# Patient Record
Sex: Male | Born: 1980 | Race: Black or African American | Hispanic: No | Marital: Married | Smoking: Current some day smoker
Health system: Southern US, Community
[De-identification: ages and names within clinical notes are randomized; demographics above are authoritative.]

## PROBLEM LIST (undated history)

## (undated) DIAGNOSIS — IMO0002 Reserved for concepts with insufficient information to code with codable children: Secondary | ICD-10-CM

## (undated) DIAGNOSIS — B20 Human immunodeficiency virus [HIV] disease: Secondary | ICD-10-CM

## (undated) DIAGNOSIS — K219 Gastro-esophageal reflux disease without esophagitis: Secondary | ICD-10-CM

## (undated) HISTORY — PX: WISDOM TOOTH EXTRACTION: SHX21

## (undated) HISTORY — DX: Gastro-esophageal reflux disease without esophagitis: K21.9

## (undated) HISTORY — PX: COLONOSCOPY: SHX174

## (undated) HISTORY — DX: Reserved for concepts with insufficient information to code with codable children: IMO0002

## (undated) HISTORY — PX: UPPER GASTROINTESTINAL ENDOSCOPY: SHX188

---

## 2003-11-26 ENCOUNTER — Emergency Department (HOSPITAL_COMMUNITY): Admission: EM | Admit: 2003-11-26 | Discharge: 2003-11-27 | Payer: Self-pay | Admitting: Emergency Medicine

## 2008-01-20 ENCOUNTER — Emergency Department (HOSPITAL_COMMUNITY): Admission: EM | Admit: 2008-01-20 | Discharge: 2008-01-20 | Payer: Self-pay | Admitting: Emergency Medicine

## 2008-09-05 ENCOUNTER — Emergency Department (HOSPITAL_COMMUNITY): Admission: EM | Admit: 2008-09-05 | Discharge: 2008-09-06 | Payer: Self-pay | Admitting: Emergency Medicine

## 2008-12-12 ENCOUNTER — Ambulatory Visit (HOSPITAL_BASED_OUTPATIENT_CLINIC_OR_DEPARTMENT_OTHER): Admission: RE | Admit: 2008-12-12 | Discharge: 2008-12-12 | Payer: Self-pay | Admitting: Otolaryngology

## 2008-12-17 ENCOUNTER — Ambulatory Visit: Payer: Self-pay | Admitting: Internal Medicine

## 2008-12-20 ENCOUNTER — Ambulatory Visit: Payer: Self-pay | Admitting: Radiology

## 2008-12-20 ENCOUNTER — Emergency Department (HOSPITAL_BASED_OUTPATIENT_CLINIC_OR_DEPARTMENT_OTHER): Admission: EM | Admit: 2008-12-20 | Discharge: 2008-12-20 | Payer: Self-pay | Admitting: Emergency Medicine

## 2009-10-25 ENCOUNTER — Emergency Department (HOSPITAL_COMMUNITY): Admission: EM | Admit: 2009-10-25 | Discharge: 2009-10-25 | Payer: Self-pay | Admitting: Emergency Medicine

## 2010-05-03 ENCOUNTER — Emergency Department (HOSPITAL_COMMUNITY): Admission: EM | Admit: 2010-05-03 | Discharge: 2010-05-03 | Payer: Self-pay | Admitting: Emergency Medicine

## 2010-05-07 ENCOUNTER — Emergency Department (HOSPITAL_COMMUNITY): Admission: EM | Admit: 2010-05-07 | Discharge: 2010-05-07 | Payer: Self-pay | Admitting: Emergency Medicine

## 2010-06-30 ENCOUNTER — Emergency Department (HOSPITAL_COMMUNITY): Admission: EM | Admit: 2010-06-30 | Discharge: 2010-06-30 | Payer: Self-pay | Admitting: Emergency Medicine

## 2010-07-02 ENCOUNTER — Emergency Department (HOSPITAL_COMMUNITY): Admission: EM | Admit: 2010-07-02 | Discharge: 2010-07-02 | Payer: Self-pay | Admitting: Emergency Medicine

## 2010-07-04 ENCOUNTER — Emergency Department (HOSPITAL_COMMUNITY): Admission: EM | Admit: 2010-07-04 | Discharge: 2010-07-04 | Payer: Self-pay | Admitting: Emergency Medicine

## 2010-09-09 ENCOUNTER — Emergency Department (HOSPITAL_COMMUNITY): Admission: EM | Admit: 2010-09-09 | Discharge: 2010-09-09 | Payer: Self-pay | Admitting: Emergency Medicine

## 2010-10-19 ENCOUNTER — Emergency Department (HOSPITAL_COMMUNITY)
Admission: EM | Admit: 2010-10-19 | Discharge: 2010-10-20 | Payer: Self-pay | Source: Home / Self Care | Admitting: Emergency Medicine

## 2010-12-01 ENCOUNTER — Ambulatory Visit (HOSPITAL_COMMUNITY)
Admission: RE | Admit: 2010-12-01 | Discharge: 2010-12-01 | Payer: Self-pay | Source: Home / Self Care | Attending: Psychiatry | Admitting: Psychiatry

## 2010-12-05 ENCOUNTER — Emergency Department (HOSPITAL_COMMUNITY)
Admission: EM | Admit: 2010-12-05 | Discharge: 2010-12-05 | Payer: Self-pay | Source: Home / Self Care | Admitting: Emergency Medicine

## 2010-12-11 ENCOUNTER — Other Ambulatory Visit (HOSPITAL_COMMUNITY)
Admission: RE | Admit: 2010-12-11 | Discharge: 2010-12-12 | Payer: Self-pay | Source: Home / Self Care | Attending: Psychiatry | Admitting: Psychiatry

## 2010-12-18 ENCOUNTER — Ambulatory Visit (HOSPITAL_COMMUNITY): Admit: 2010-12-18 | Payer: Self-pay | Admitting: Licensed Clinical Social Worker

## 2011-02-12 LAB — HEMOCCULT GUIAC POC 1CARD (OFFICE): Fecal Occult Bld: POSITIVE

## 2011-02-14 LAB — DIFFERENTIAL
Basophils Absolute: 0.1 10*3/uL (ref 0.0–0.1)
Basophils Relative: 1 % (ref 0–1)
Eosinophils Relative: 1 % (ref 0–5)
Monocytes Relative: 8 % (ref 3–12)

## 2011-02-14 LAB — CBC
HCT: 36.8 % — ABNORMAL LOW (ref 39.0–52.0)
MCV: 82.5 fL (ref 78.0–100.0)
RDW: 13.7 % (ref 11.5–15.5)
WBC: 6.8 10*3/uL (ref 4.0–10.5)

## 2011-02-15 LAB — BASIC METABOLIC PANEL
BUN: 9 mg/dL (ref 6–23)
CO2: 28 mEq/L (ref 19–32)
Calcium: 8.7 mg/dL (ref 8.4–10.5)
Chloride: 104 mEq/L (ref 96–112)
Creatinine, Ser: 1.13 mg/dL (ref 0.4–1.5)
GFR calc non Af Amer: >60 mL/min (ref >60)
Glucose, Bld: 86 mg/dL (ref 70–99)

## 2011-02-15 LAB — URINE CULTURE
Colony Count: NO GROWTH
Culture  Setup Time: 201108040246
Culture: NO GROWTH

## 2011-02-15 LAB — DIFFERENTIAL
Basophils Absolute: 0 10*3/uL (ref 0.0–0.1)
Basophils Relative: 1 % (ref 0–1)
Eosinophils Absolute: 0 K/uL (ref 0.0–0.7)
Eosinophils Relative: 0 % (ref 0–5)
Lymphocytes Relative: 52 % — ABNORMAL HIGH (ref 12–46)
Lymphs Abs: 1.6 K/uL (ref 0.7–4.0)
Monocytes Absolute: 0.2 10*3/uL (ref 0.1–1.0)
Monocytes Relative: 6 % (ref 3–12)
Neutro Abs: 1.3 10*3/uL — ABNORMAL LOW (ref 1.7–7.7)
Neutrophils Relative %: 42 % — ABNORMAL LOW (ref 43–77)

## 2011-02-15 LAB — CBC
HCT: 39.7 % (ref 39.0–52.0)
Hemoglobin: 13.3 g/dL (ref 13.0–17.0)
MCH: 28 pg (ref 26.0–34.0)
MCHC: 33.6 g/dL (ref 30.0–36.0)
MCV: 83.4 fL (ref 78.0–100.0)
Platelets: 118 K/uL — ABNORMAL LOW (ref 150–400)
RBC: 4.76 MIL/uL (ref 4.22–5.81)
RDW: 12.1 % (ref 11.5–15.5)
WBC: 3.2 10*3/uL — ABNORMAL LOW (ref 4.0–10.5)

## 2011-02-15 LAB — URINALYSIS, ROUTINE W REFLEX MICROSCOPIC
Bilirubin Urine: NEGATIVE
Glucose, UA: NEGATIVE mg/dL
Hgb urine dipstick: NEGATIVE
Ketones, ur: 15 mg/dL — AB
Nitrite: NEGATIVE
Protein, ur: NEGATIVE mg/dL
Specific Gravity, Urine: 1.023 (ref 1.005–1.030)
Urobilinogen, UA: 4 mg/dL — ABNORMAL HIGH (ref 0.0–1.0)
pH: 7.5 (ref 5.0–8.0)

## 2011-02-15 LAB — CULTURE, BLOOD (ROUTINE X 2)
Culture: NO GROWTH
Culture: NO GROWTH

## 2011-02-15 LAB — BASIC METABOLIC PANEL WITH GFR
GFR calc Af Amer: >60 mL/min (ref >60)
Potassium: 3.5 meq/L (ref 3.5–5.1)
Sodium: 140 meq/L (ref 135–145)

## 2011-02-18 LAB — URINALYSIS, ROUTINE W REFLEX MICROSCOPIC
Bilirubin Urine: NEGATIVE
Ketones, ur: NEGATIVE mg/dL
Ketones, ur: NEGATIVE mg/dL
Nitrite: NEGATIVE
Protein, ur: NEGATIVE mg/dL
Urobilinogen, UA: 0.2 mg/dL (ref 0.0–1.0)
Urobilinogen, UA: 1 mg/dL (ref 0.0–1.0)

## 2011-02-18 LAB — DIFFERENTIAL
Basophils Absolute: 0.1 10*3/uL (ref 0.0–0.1)
Eosinophils Relative: 2 % (ref 0–5)
Eosinophils Relative: 2 % (ref 0–5)
Lymphocytes Relative: 37 % (ref 12–46)
Lymphs Abs: 2.5 10*3/uL (ref 0.7–4.0)
Lymphs Abs: 6.2 10*3/uL — ABNORMAL HIGH (ref 0.7–4.0)
Monocytes Absolute: 0.4 10*3/uL (ref 0.1–1.0)
Neutro Abs: 5 10*3/uL (ref 1.7–7.7)
Neutrophils Relative %: 40 % — ABNORMAL LOW (ref 43–77)

## 2011-02-18 LAB — BASIC METABOLIC PANEL
BUN: 13 mg/dL (ref 6–23)
CO2: 23 mEq/L (ref 19–32)
Calcium: 9.5 mg/dL (ref 8.4–10.5)
Chloride: 107 mEq/L (ref 96–112)
Creatinine, Ser: 1.48 mg/dL (ref 0.4–1.5)
GFR calc non Af Amer: 55 mL/min — ABNORMAL LOW (ref >60)

## 2011-02-18 LAB — HEPATIC FUNCTION PANEL
AST: 29 U/L (ref 0–37)
Albumin: 4.1 g/dL (ref 3.5–5.2)
Alkaline Phosphatase: 49 U/L (ref 39–117)
Bilirubin, Direct: 0.2 mg/dL (ref 0.0–0.3)
Total Bilirubin: 0.9 mg/dL (ref 0.3–1.2)
Total Protein: 7.2 g/dL (ref 6.0–8.3)

## 2011-02-18 LAB — RAPID URINE DRUG SCREEN, HOSP PERFORMED
Benzodiazepines: NOT DETECTED
Cocaine: NOT DETECTED
Tetrahydrocannabinol: POSITIVE — AB

## 2011-02-18 LAB — CBC
HCT: 39.8 % (ref 39.0–52.0)
MCHC: 32.8 g/dL (ref 30.0–36.0)
MCHC: 32.9 g/dL (ref 30.0–36.0)
MCV: 86.8 fL (ref 78.0–100.0)
Platelets: 179 10*3/uL (ref 150–400)
Platelets: 207 10*3/uL (ref 150–400)
RBC: 4.59 MIL/uL (ref 4.22–5.81)
RDW: 12.5 % (ref 11.5–15.5)
WBC: 6.9 10*3/uL (ref 4.0–10.5)

## 2011-02-18 LAB — COMPREHENSIVE METABOLIC PANEL
AST: 17 U/L (ref 0–37)
Albumin: 4.2 g/dL (ref 3.5–5.2)
Chloride: 105 mEq/L (ref 96–112)
Creatinine, Ser: 1.1 mg/dL (ref 0.4–1.5)
GFR calc Af Amer: >60 mL/min (ref >60)
Total Bilirubin: 1.6 mg/dL — ABNORMAL HIGH (ref 0.3–1.2)

## 2011-03-18 LAB — BASIC METABOLIC PANEL
BUN: 12 mg/dL (ref 6–23)
CO2: 28 mEq/L (ref 19–32)
Chloride: 104 mEq/L (ref 96–112)
Glucose, Bld: 75 mg/dL (ref 70–99)
Potassium: 3.8 mEq/L (ref 3.5–5.1)

## 2011-03-18 LAB — CBC
HCT: 39.5 % (ref 39.0–52.0)
MCHC: 32.9 g/dL (ref 30.0–36.0)
MCV: 85.8 fL (ref 78.0–100.0)
Platelets: 159 10*3/uL (ref 150–400)
RDW: 11.8 % (ref 11.5–15.5)

## 2011-03-18 LAB — GLUCOSE, CAPILLARY: Glucose-Capillary: 76 mg/dL (ref 70–99)

## 2011-03-18 LAB — DIFFERENTIAL
Basophils Absolute: 0.1 10*3/uL (ref 0.0–0.1)
Eosinophils Absolute: 0.1 10*3/uL (ref 0.0–0.7)
Eosinophils Relative: 2 % (ref 0–5)

## 2011-04-16 NOTE — Procedures (Signed)
NAME:  [name], [name]                ACCOUNT NO.:  [id number]   MEDICAL RECORD NO.:  [id number]          PATIENT TYPE:  OUT   LOCATION:  SLEEP CENTER                 FACILITY:  [facility]   PHYSICIAN:  Clinton D. Maple Hudson, MD, FCCP, FACPDATE OF BIRTH:  30-Apr-1979   DATE OF STUDY:  12/12/2008                            NOCTURNAL POLYSOMNOGRAM   REFERRING PHYSICIAN:  Antony Contras, MD   REFERRING PHYSICIAN:  Antony Contras, MD   INDICATION FOR STUDY:  Hypersomnia with sleep apnea.   EPWORTH SLEEPINESS SCORE:  Epworth sleepiness score 3/24.  BMI 25.  Weight 195 pounds.  Height 74 inches.  Neck 17 inches.   MEDICATIONS:  Home medications were not listed.   SLEEP ARCHITECTURE:  Total sleep time 355 minutes with sleep efficiency  74.6%.  Stage I was 11%.  Stage II 66.4%.  Stage III absent.  REM 22.6%  of total sleep time.  Sleep latency 104 minutes.  REM latency 66  minutes.  Awake after sleep onset 16.5 minutes.  Arousal index 14.2.  No  bedtime medication was taken.   RESPIRATORY DATA:  Apnea-hypopnea index (AHI) 1.4 per hour, respiratory  disturbance index (RDI) 5.1 per hour.  A total of 8 events were scored  including 1 obstructive apnea and 7 hypopneas.  All events were recorded  as supine.  Respiratory events related to arousal count 22, index 3.7  per hour.  There were insufficient events to permit CPAP titration by  split protocol on the study night.   OXYGEN DATA:  Moderately loud snoring with oxygen desaturation to a  nadir of 91%.  Mean oxygen saturation through the study was 94.8% on  room air.   CARDIAC DATA:  Normal sinus rhythm.   MOVEMENT-PARASOMNIA:  No significant movement disturbance.  No bathroom  trips.   IMPRESSION/RECOMMENDATION:  Occasional respiratory events with arousal,  within normal limits, AHI 1.4 per hour (normal range 0-5 per hour).  Events were all recorded as supine.  Moderately loud snoring with oxygen  desaturation to a nadir of 91%.      Clinton  D. Maple Hudson, MD, [facility], FACP  Diplomate, Biomedical engineer of Sleep Medicine  Electronically Signed     CDY/MEDQ  D:  12/17/2008 10:42:05  T:  12/17/2008 23:55:49  Job:  [id number]

## 2012-07-12 ENCOUNTER — Encounter (HOSPITAL_BASED_OUTPATIENT_CLINIC_OR_DEPARTMENT_OTHER): Payer: Self-pay | Admitting: *Deleted

## 2012-07-12 DIAGNOSIS — F17200 Nicotine dependence, unspecified, uncomplicated: Secondary | ICD-10-CM | POA: Insufficient documentation

## 2012-07-12 DIAGNOSIS — R112 Nausea with vomiting, unspecified: Secondary | ICD-10-CM | POA: Insufficient documentation

## 2012-07-12 DIAGNOSIS — Z21 Asymptomatic human immunodeficiency virus [HIV] infection status: Secondary | ICD-10-CM | POA: Insufficient documentation

## 2012-07-12 DIAGNOSIS — R197 Diarrhea, unspecified: Secondary | ICD-10-CM | POA: Insufficient documentation

## 2012-07-12 MED ORDER — ONDANSETRON 8 MG PO TBDP
8.0000 mg | ORAL_TABLET | Freq: Once | ORAL | Status: AC
  Administered 2012-07-12: 8 mg via ORAL
  Filled 2012-07-12: qty 1

## 2012-07-12 NOTE — ED Notes (Signed)
N/V/D since Friday. Feels dehydrated.

## 2012-07-13 ENCOUNTER — Emergency Department (HOSPITAL_BASED_OUTPATIENT_CLINIC_OR_DEPARTMENT_OTHER)
Admission: EM | Admit: 2012-07-13 | Discharge: 2012-07-13 | Disposition: A | Discharge status: Home / Self Care | Payer: BC Managed Care – PPO | Attending: Emergency Medicine | Admitting: Emergency Medicine

## 2012-07-13 DIAGNOSIS — K529 Noninfective gastroenteritis and colitis, unspecified: Secondary | ICD-10-CM

## 2012-07-13 HISTORY — DX: Human immunodeficiency virus (HIV) disease: B20

## 2012-07-13 LAB — URINALYSIS, ROUTINE W REFLEX MICROSCOPIC
Bilirubin Urine: NEGATIVE
Ketones, ur: NEGATIVE mg/dL
Leukocytes, UA: NEGATIVE
Nitrite: NEGATIVE
Protein, ur: NEGATIVE mg/dL
Urobilinogen, UA: 1 mg/dL (ref 0.0–1.0)
pH: 6 (ref 5.0–8.0)

## 2012-07-13 LAB — CBC WITH DIFFERENTIAL/PLATELET
Basophils Absolute: 0.1 10*3/uL (ref 0.0–0.1)
Basophils Relative: 1 % (ref 0–1)
Eosinophils Absolute: 0.1 10*3/uL (ref 0.0–0.7)
MCH: 29 pg (ref 26.0–34.0)
MCHC: 34.3 g/dL (ref 30.0–36.0)
Monocytes Absolute: 0.7 10*3/uL (ref 0.1–1.0)
Neutro Abs: 4 10*3/uL (ref 1.7–7.7)
Neutrophils Relative %: 50 % (ref 43–77)
RDW: 12 % (ref 11.5–15.5)

## 2012-07-13 LAB — BASIC METABOLIC PANEL
Chloride: 102 mEq/L (ref 96–112)
Creatinine, Ser: 1.3 mg/dL (ref 0.50–1.35)
GFR calc Af Amer: 82 mL/min — ABNORMAL LOW (ref >90)
GFR calc non Af Amer: 71 mL/min — ABNORMAL LOW (ref >90)
Potassium: 3.8 mEq/L (ref 3.5–5.1)

## 2012-07-13 MED ORDER — SODIUM CHLORIDE 0.9 % IV BOLUS (SEPSIS)
1000.0000 mL | Freq: Once | INTRAVENOUS | Status: AC
  Administered 2012-07-13: 1000 mL via INTRAVENOUS

## 2012-07-13 MED ORDER — DIPHENOXYLATE-ATROPINE 2.5-0.025 MG PO TABS
2.0000 | ORAL_TABLET | Freq: Once | ORAL | Status: AC
  Administered 2012-07-13: 2 via ORAL
  Filled 2012-07-13: qty 2

## 2012-07-13 MED ORDER — ONDANSETRON 8 MG PO TBDP: 8.0000 mg | ORAL_TABLET | Freq: Three times a day (TID) | ORAL | Status: AC | PRN

## 2012-07-13 NOTE — ED Notes (Signed)
Ambulated in the hall. Tolerated well. MD updated

## 2012-07-13 NOTE — ED Notes (Signed)
MD at bedside. 

## 2012-07-13 NOTE — ED Provider Notes (Signed)
History  This chart was scribed for Hanley Seamen, MD by Shari Heritage. The patient was seen in room MH05/MH05. Patient's care was started at 2222.     CSN: [id number]  Arrival date & time 07/12/12  2222   First MD Initiated Contact with Patient 07/13/12 0058      Chief Complaint  Patient presents with  . Nausea, vomiting and diarrhea     The history is provided by the patient. No language interpreter was used.   Dillon Wilson is a 31 y.o. male with a history of HIV who presents to the Emergency Department complaining of persistent nausea, vomiting and diarrhea onset 3 days ago. Other symptoms include generalized weakness and light-headedness. He also states that he feels dehydrated. Patient hasn't reported any specific aggravating or relieving factors. Patient is a current everyday smoker.  PCP - Merdis Delay [place], [place])   Past Medical History  Diagnosis Date  . HIV disease     Past Surgical History  Procedure Date  . Wisdom tooth extraction     No family history on file.  History  Substance Use Topics  . Smoking status: Current Everyday Smoker  . Smokeless tobacco: Not on file  . Alcohol Use: Yes     occasional      Review of Systems A complete 10 system review of systems was obtained and all systems are negative except as noted in the HPI and PMH.   Allergies  Ciprofloxacin  Home Medications   Current Outpatient Rx  Name Route Sig Dispense Refill  . AMOXICILLIN 500 MG PO TABS Oral Take 500 mg by mouth every 6 (six) hours. For 7 days starting 06/29/12    . DARUNAVIR ETHANOLATE 400 MG PO TABS Oral Take 800 mg by mouth daily. Through a study at [facility]    . EMTRICITABINE-TENOFOVIR 200-300 MG PO TABS Oral Take 1 tablet by mouth daily. Through a study at [facility]    . HYDROCODONE-ACETAMINOPHEN PO Oral Take 1 tablet by mouth every 4 (four) hours as needed. For pain    . IBUPROFEN 200 MG PO TABS Oral Take 600 mg by mouth every 6  (six) hours as needed. For pain    . OMEPRAZOLE 20 MG PO CPDR Oral Take 20 mg by mouth daily.    . STUDY MEDICATION Oral Take 1 tablet by mouth daily. Cobicistat 150 mg through [facility]      BP 130/81  Pulse 73  Temp 98.9 F (37.2 C) (Oral)  Resp 20  SpO2 99%  Physical Exam  Constitutional: He is oriented to person, place, and time. He appears well-developed and well-nourished. He has a sickly appearance.       Patient is ill-appearing.  HENT:  Head: Normocephalic and atraumatic.  Eyes: Pupils are equal, round, and reactive to light.  Cardiovascular: Normal rate and regular rhythm.   No murmur heard. Pulmonary/Chest: Effort normal and breath sounds normal. No respiratory distress. He has no wheezes. He has no rales.  Abdominal: Soft. He exhibits no distension. Bowel sounds are decreased. There is no tenderness.  Neurological: He is alert and oriented to person, place, and time.    ED Course  Procedures (including critical care time) DIAGNOSTIC STUDIES: Oxygen Saturation is 99% on room air, normal by my interpretation.    COORDINATION OF CARE: 1:01am- Patient informed of current plan for treatment and evaluation and agrees with plan at this time.       MDM  Nursing notes and vitals signs, including pulse oximetry, reviewed.  Summary of this visit's results, reviewed by myself:  Labs:  Results for orders placed during the hospital encounter of 07/13/12  CBC WITH DIFFERENTIAL      Component Value Range   WBC 8.0  4.0 - 10.5 K/uL   RBC 4.38  4.22 - 5.81 MIL/uL   Hemoglobin 12.7 (*) 13.0 - 17.0 g/dL   HCT 16.1 (*) 09.6 - 04.5 %   MCV 84.5  78.0 - 100.0 fL   MCH 29.0  26.0 - 34.0 pg   MCHC 34.3  30.0 - 36.0 g/dL   RDW 40.9  81.1 - 91.4 %   Platelets 185  150 - 400 K/uL   Neutrophils Relative 50  43 - 77 %   Neutro Abs 4.0  1.7 - 7.7 K/uL   Lymphocytes Relative 39  12 - 46 %   Lymphs Abs 3.1  0.7 - 4.0 K/uL   Monocytes Relative 9  3 - 12 %    Monocytes Absolute 0.7  0.1 - 1.0 K/uL   Eosinophils Relative 1  0 - 5 %   Eosinophils Absolute 0.1  0.0 - 0.7 K/uL   Basophils Relative 1  0 - 1 %   Basophils Absolute 0.1  0.0 - 0.1 K/uL  BASIC METABOLIC PANEL      Component Value Range   Sodium 137  135 - 145 mEq/L   Potassium 3.8  3.5 - 5.1 mEq/L   Chloride 102  96 - 112 mEq/L   CO2 27  19 - 32 mEq/L   Glucose, Bld 106 (*) 70 - 99 mg/dL   BUN 14  6 - 23 mg/dL   Creatinine, Ser 7.82  0.50 - 1.35 mg/dL   Calcium 9.4  8.4 - 95.6 mg/dL   GFR calc non Af Amer 71 (*) >90 mL/min   GFR calc Af Amer 82 (*) >90 mL/min  URINALYSIS, ROUTINE W REFLEX MICROSCOPIC      Component Value Range   Color, Urine YELLOW  YELLOW   APPearance CLEAR  CLEAR   Specific Gravity, Urine 1.016  1.005 - 1.030   pH 6.0  5.0 - 8.0   Glucose, UA NEGATIVE  NEGATIVE mg/dL   Hgb urine dipstick NEGATIVE  NEGATIVE   Bilirubin Urine NEGATIVE  NEGATIVE   Ketones, ur NEGATIVE  NEGATIVE mg/dL   Protein, ur NEGATIVE  NEGATIVE mg/dL   Urobilinogen, UA 1.0  0.0 - 1.0 mg/dL   Nitrite NEGATIVE  NEGATIVE   Leukocytes, UA NEGATIVE  NEGATIVE   I personally performed the services described in this documentation, which was scribed in my presence.  The recorded information has been reviewed and considered.  4:08 AM Feels much better after 2 L of normal saline IV. Able to ambulate without lightheadedness.    Hanley Seamen, MD 07/13/12 [phone]

## 2012-07-24 ENCOUNTER — Ambulatory Visit (INDEPENDENT_AMBULATORY_CARE_PROVIDER_SITE_OTHER): Payer: BC Managed Care – PPO | Admitting: Emergency Medicine

## 2012-07-24 VITALS — BP 114/72 | HR 77 | Temp 98.4°F | Resp 16 | Ht 71.58 in | Wt 182.4 lb

## 2012-07-24 DIAGNOSIS — F419 Anxiety disorder, unspecified: Secondary | ICD-10-CM

## 2012-07-24 DIAGNOSIS — F329 Major depressive disorder, single episode, unspecified: Secondary | ICD-10-CM

## 2012-07-24 DIAGNOSIS — F341 Dysthymic disorder: Secondary | ICD-10-CM

## 2012-07-24 MED ORDER — LORAZEPAM 0.5 MG PO TABS: 0.5000 mg | ORAL_TABLET | Freq: Three times a day (TID) | ORAL | Status: AC | PRN

## 2012-07-24 MED ORDER — PAROXETINE HCL 20 MG PO TABS: 20.0000 mg | ORAL_TABLET | ORAL | Status: DC

## 2012-07-24 NOTE — Progress Notes (Signed)
   Date:  07/24/2012   Name:  JAZON JIPSON   DOB:  [birth year]   MRN:  [id number] Gender: male  Age: 31 y.o.  PCP:  No primary provider on file.    Chief Complaint: Rash and Anxiety   History of Present Illness:  Dillon Wilson is a 31 y.o. pleasant patient who presents with the following:  Has severe anxiety.  Mother is in hospice with stage 4 colon cancer and he has numerous other stresses including his job and home issues.  Is not sleeping and feels as though he is "losing it'.  Denies thoughts of self harm or harm to others.  He is experiencing severe itching on his scalp.  Denies contact with other with lice, scabies other parasitic diseases and has no rash or evidence of infestation.    There is no problem list on file for this patient.   Past Medical History  Diagnosis Date  . HIV disease     Past Surgical History  Procedure Date  . Wisdom tooth extraction     History  Substance Use Topics  . Smoking status: Current Everyday Smoker  . Smokeless tobacco: Not on file  . Alcohol Use: Yes     occasional    No family history on file.  Allergies  Allergen Reactions  . Ciprofloxacin Diarrhea and Nausea Only    Medication list has been reviewed and updated.  Current Outpatient Prescriptions on File Prior to Visit  Medication Sig Dispense Refill  . darunavir (PREZISTA) 400 MG tablet Take 800 mg by mouth daily. Through a study at [facility]      . emtricitabine-tenofovir (TRUVADA) 200-300 MG per tablet Take 1 tablet by mouth daily. Through a study at [facility]      . ibuprofen (ADVIL,MOTRIN) 200 MG tablet Take 600 mg by mouth every 6 (six) hours as needed. For pain      . omeprazole (PRILOSEC) 20 MG capsule Take 20 mg by mouth daily.      . STUDY MEDICATION Take 1 tablet by mouth daily. Cobicistat 150 mg through [facility]      . amoxicillin (AMOXIL) 500 MG tablet Take 500 mg by mouth every 6 (six) hours. For 7 days starting  06/29/12      . HYDROCODONE-ACETAMINOPHEN PO Take 1 tablet by mouth every 4 (four) hours as needed. For pain      . PARoxetine (PAXIL) 20 MG tablet Take 1 tablet (20 mg total) by mouth every morning.  30 tablet  5    Review of Systems:  As per HPI, otherwise negative.    Physical Examination: Filed Vitals:   07/24/12 1802  BP: 114/72  Pulse: 77  Temp: 98.4 F (36.9 C)  Resp: 16   Filed Vitals:   07/24/12 1802  Height: 5' 11.58" (1.818 m)  Weight: 182 lb 6.4 oz (82.736 kg)   Body mass index is 25.03 kg/(m^2). Ideal Body Weight: Weight in (lb) to have BMI = 25: 181.8    GEN: WDWN, NAD, Non-toxic, Alert & Oriented x 3 HEENT: Atraumatic, Normocephalic.  Ears and Nose: No external deformity. EXTR: No clubbing/cyanosis/edema NEURO: Normal gait.  PSYCH: Normally interactive. Conversant. Not depressed.  Appears emotionally labile and anxious at times.    Assessment and Plan: Anxiety and situational depression. paxil Ativan Follow up in one week if not improved.  Carmelina Dane, MD

## 2012-09-15 ENCOUNTER — Telehealth: Payer: Self-pay

## 2012-09-15 NOTE — Telephone Encounter (Signed)
Office notes from last visit printed and ready for pick up. Patient notified.

## 2012-09-15 NOTE — Telephone Encounter (Signed)
Patient is requesting a copy of his last office visit for him to come by and pick-up.  Patient is aware that we do ask for 24-48 hrs to have records ready.   BEST#: [phone]

## 2012-10-02 DIAGNOSIS — F411 Generalized anxiety disorder: Secondary | ICD-10-CM | POA: Insufficient documentation

## 2012-11-03 ENCOUNTER — Ambulatory Visit (INDEPENDENT_AMBULATORY_CARE_PROVIDER_SITE_OTHER): Payer: BC Managed Care – PPO | Admitting: Physician Assistant

## 2012-11-03 VITALS — BP 120/72 | HR 79 | Temp 98.8°F | Resp 18 | Ht 73.0 in | Wt 182.0 lb

## 2012-11-03 DIAGNOSIS — R509 Fever, unspecified: Secondary | ICD-10-CM

## 2012-11-03 DIAGNOSIS — R110 Nausea: Secondary | ICD-10-CM

## 2012-11-03 DIAGNOSIS — R1011 Right upper quadrant pain: Secondary | ICD-10-CM

## 2012-11-03 DIAGNOSIS — B20 Human immunodeficiency virus [HIV] disease: Secondary | ICD-10-CM

## 2012-11-03 LAB — COMPREHENSIVE METABOLIC PANEL
Albumin: 4 g/dL (ref 3.5–5.2)
Alkaline Phosphatase: 66 U/L (ref 39–117)
BUN: 11 mg/dL (ref 6–23)
CO2: 28 mEq/L (ref 19–32)
Calcium: 9.1 mg/dL (ref 8.4–10.5)
Chloride: 102 mEq/L (ref 96–112)
Glucose, Bld: 86 mg/dL (ref 70–99)
Potassium: 4.1 mEq/L (ref 3.5–5.3)
Sodium: 139 mEq/L (ref 135–145)
Total Protein: 7.4 g/dL (ref 6.0–8.3)

## 2012-11-03 LAB — POCT INFLUENZA A/B
Influenza A, POC: NEGATIVE
Influenza B, POC: NEGATIVE

## 2012-11-03 LAB — POCT CBC
Granulocyte percent: 73.1 %G (ref 37–80)
HCT, POC: 42.1 % — AB (ref 43.5–53.7)
Hemoglobin: 12.5 g/dL — AB (ref 14.1–18.1)
MCV: 88.8 fL (ref 80–97)
POC Granulocyte: 4.5 (ref 2–6.9)
POC LYMPH PERCENT: 21.7 %L (ref 10–50)
RBC: 4.74 M/uL (ref 4.69–6.13)

## 2012-11-03 MED ORDER — ONDANSETRON 8 MG PO TBDP: 8.0000 mg | ORAL_TABLET | Freq: Three times a day (TID) | ORAL | Status: DC | PRN

## 2012-11-03 NOTE — Progress Notes (Signed)
Subjective:    Patient ID: Dillon Harton., male    DOB: [birth year], 31 y.o.   MRN: [id number]  HPI   Dillon Wilson is a 31 yr old male who states he thinks he has the flu.  Symptoms began 4 days ago.  He complains of stomach cramps, HA, diarrhea, chills, sweats, and fever.  Tmax 101.49F.  Has not had diarrhea in two days now.  But is now nauseous.  No vomiting.  Not taking much by mouth.  Last night a little chicken soup.  This AM some dry toast.  States he is keeping hydrated with gatorade and water.  Some coworkers have been sick.  Denies any food exposures.  No travel.  Has had sore throat on and off.  Denies urinary symptoms.  Denies blood or mucus in the stool.    Received flu shot 4 days ago  Pt recently had RUQ u/s in [place] for abd pain.  States it was normal except for fatty liver.    Currently smokes 1ppd.  Review of Systems  Constitutional: Positive for fever, chills, diaphoresis and fatigue.  Respiratory: Negative.   Cardiovascular: Negative.   Gastrointestinal: Positive for nausea, abdominal pain and diarrhea. Negative for vomiting.  Musculoskeletal: Positive for myalgias and arthralgias.  Skin: Negative.   Neurological: Positive for light-headedness and headaches.       Objective:   Physical Exam  Vitals reviewed. Constitutional: He is oriented to person, place, and time. He appears well-developed. He appears ill.  HENT:  Head: Normocephalic and atraumatic.  Right Ear: Tympanic membrane and ear canal normal.  Left Ear: Tympanic membrane and ear canal normal.  Nose: Nose normal.  Mouth/Throat: Uvula is midline, oropharynx is clear and moist and mucous membranes are normal.  Neck: Neck supple.  Cardiovascular: Normal rate, regular rhythm and normal heart sounds.  Exam reveals no gallop and no friction rub.   No murmur heard. Pulmonary/Chest: Effort normal and breath sounds normal. He has no wheezes. He has no rales.  Abdominal: Soft. Normal appearance and bowel  sounds are normal. He exhibits no distension and no mass. There is tenderness in the right upper quadrant and epigastric area. There is guarding and positive Murphy's sign. There is no rigidity, no rebound, no CVA tenderness and no tenderness at McBurney's point.  Lymphadenopathy:    He has no cervical adenopathy.  Neurological: He is alert and oriented to person, place, and time.  Skin: Skin is warm and dry.  Psychiatric: He has a normal mood and affect. His behavior is normal.     Filed Vitals:   11/03/12 1439  BP: 120/72  Pulse: 79  Temp: 98.8 F (37.1 C)  Resp: 18     Results for orders placed in visit on 11/03/12  POCT CBC      Component Value Range   WBC 6.1  4.6 - 10.2 K/uL   Lymph, poc 1.3  0.6 - 3.4   POC LYMPH PERCENT 21.7  10 - 50 %L   MID (cbc) 0.3  0 - 0.9   POC MID % 5.2  0 - 12 %M   POC Granulocyte 4.5  2 - 6.9   Granulocyte percent 73.1  37 - 80 %G   RBC 4.74  4.69 - 6.13 M/uL   Hemoglobin 12.5 (*) 14.1 - 18.1 g/dL   HCT, POC 96.2 (*) 95.2 - 53.7 %   MCV 88.8  80 - 97 fL   MCH, POC 26.4 (*) 27 - 31.2  pg   MCHC 29.7 (*) 31.8 - 35.4 g/dL   RDW, POC 16.1     Platelet Count, POC 163  142 - 424 K/uL   MPV 9.1  0 - 99.8 fL  POCT INFLUENZA A/B      Component Value Range   Influenza A, POC Negative     Influenza B, POC Negative        Ultrasound report from [facility] 10/02/12: 1. Mild hepatic steatosis 2. Small volume of gallbladder sludge, no evidence of cholelithiasis or acute cholecytitis    Assessment & Plan:   1. Fever  POCT CBC, Comprehensive metabolic panel, Lipase, POCT Influenza A/B  2. RUQ pain  POCT CBC, Comprehensive metabolic panel, Lipase  3. Nausea  POCT CBC, Comprehensive metabolic panel, Lipase     Dillon Wilson is a 30 yr old male here with fever, chills, nausea, and RUQ pain.  Pt has a history of HIV and is followed by ID at [facility].  Last CD4 count in Oct 2013  was ~550.  White count today is normal and he is afebrile  in clinic.  Logged into [facility] Epic to review chart.  Apparently pt has had RUQ pain, nausea, and vomiting for several months now.  He tells me he has a GI appointment in February, which was the first available.  Ultrasound done at [facility] 10/02/12 indicated sludge but no stones or acute cholecystitis.  I have ordered a cmet and lipase.  Pt requested a flu test, which was negative.  He received 1L of NS here in clinic.  I have sent zofran to his pharmacy to help with the nausea he is experiencing.  Source of fever is unclear, but he is afebrile currently and has taken no antipyretics today.  Encouraged him to contact ID, who functions as his PCP, and see if they can move his GI appt up.  Pt is more concerned about fever/chills than about his abd pain.  Encouraged Tylenol/Advil for fever reduction.  Discussed with pt that I am reassured that his white count is normal and that his last CD4 was also normal.  Encouraged bland diet and plenty of fluids.  Encouraged him to contact infectious disease who manages his care.

## 2012-11-03 NOTE — Patient Instructions (Addendum)
Continue taking Tylenol or Advil for fever.  Use Zofran every 8 hours as needed for nausea.  Eat a bland diet for the next several days.  Avoid high fat foods, caffeine, and alcohol.  Hydrate as much as possible.  Water is best.  Call ID tomorrow and ask if they are able to get your GI appointment moved earlier.  If symptoms are worsening or not improving please contact us or your primary care provider.

## 2012-11-04 ENCOUNTER — Encounter (HOSPITAL_COMMUNITY): Payer: Self-pay

## 2012-11-04 ENCOUNTER — Emergency Department (HOSPITAL_COMMUNITY)
Admission: EM | Admit: 2012-11-04 | Discharge: 2012-11-04 | Disposition: A | Discharge status: Home / Self Care | Payer: BC Managed Care – PPO | Attending: Emergency Medicine | Admitting: Emergency Medicine

## 2012-11-04 ENCOUNTER — Emergency Department (HOSPITAL_COMMUNITY): Payer: BC Managed Care – PPO

## 2012-11-04 DIAGNOSIS — K219 Gastro-esophageal reflux disease without esophagitis: Secondary | ICD-10-CM | POA: Insufficient documentation

## 2012-11-04 DIAGNOSIS — Z79899 Other long term (current) drug therapy: Secondary | ICD-10-CM | POA: Insufficient documentation

## 2012-11-04 DIAGNOSIS — L98499 Non-pressure chronic ulcer of skin of other sites with unspecified severity: Secondary | ICD-10-CM | POA: Insufficient documentation

## 2012-11-04 DIAGNOSIS — F17200 Nicotine dependence, unspecified, uncomplicated: Secondary | ICD-10-CM | POA: Insufficient documentation

## 2012-11-04 DIAGNOSIS — R1011 Right upper quadrant pain: Secondary | ICD-10-CM | POA: Insufficient documentation

## 2012-11-04 DIAGNOSIS — R197 Diarrhea, unspecified: Secondary | ICD-10-CM | POA: Insufficient documentation

## 2012-11-04 DIAGNOSIS — R110 Nausea: Secondary | ICD-10-CM | POA: Insufficient documentation

## 2012-11-04 DIAGNOSIS — B20 Human immunodeficiency virus [HIV] disease: Secondary | ICD-10-CM | POA: Insufficient documentation

## 2012-11-04 DIAGNOSIS — R509 Fever, unspecified: Secondary | ICD-10-CM | POA: Insufficient documentation

## 2012-11-04 LAB — LIPASE, BLOOD: Lipase: 19 U/L (ref 11–59)

## 2012-11-04 LAB — CBC WITH DIFFERENTIAL/PLATELET
Basophils Relative: 0 % (ref 0–1)
Eosinophils Absolute: 0.1 10*3/uL (ref 0.0–0.7)
Eosinophils Relative: 1 % (ref 0–5)
Hemoglobin: 12.1 g/dL — ABNORMAL LOW (ref 13.0–17.0)
Lymphs Abs: 1.6 10*3/uL (ref 0.7–4.0)
MCH: 28.3 pg (ref 26.0–34.0)
MCHC: 33 g/dL (ref 30.0–36.0)
MCV: 85.9 fL (ref 78.0–100.0)
Monocytes Relative: 8 % (ref 3–12)
Neutrophils Relative %: 65 % (ref 43–77)
Platelets: 154 10*3/uL (ref 150–400)
RBC: 4.27 MIL/uL (ref 4.22–5.81)

## 2012-11-04 LAB — URINALYSIS, ROUTINE W REFLEX MICROSCOPIC
Hgb urine dipstick: NEGATIVE
Ketones, ur: NEGATIVE mg/dL
Nitrite: NEGATIVE
Specific Gravity, Urine: 1.025 (ref 1.005–1.030)
Urobilinogen, UA: 2 mg/dL — ABNORMAL HIGH (ref 0.0–1.0)

## 2012-11-04 LAB — POCT I-STAT TROPONIN I: Troponin i, poc: 0 ng/mL (ref 0.00–0.08)

## 2012-11-04 LAB — COMPREHENSIVE METABOLIC PANEL
AST: 22 U/L (ref 0–37)
CO2: 24 mEq/L (ref 19–32)
Calcium: 9.1 mg/dL (ref 8.4–10.5)
Creatinine, Ser: 1.15 mg/dL (ref 0.50–1.35)
GFR calc Af Amer: >90 mL/min (ref >90)
GFR calc non Af Amer: 82 mL/min — ABNORMAL LOW (ref >90)

## 2012-11-04 MED ORDER — SODIUM CHLORIDE 0.9 % IV BOLUS (SEPSIS)
1000.0000 mL | Freq: Once | INTRAVENOUS | Status: AC
  Administered 2012-11-04: 1000 mL via INTRAVENOUS

## 2012-11-04 MED ORDER — MORPHINE SULFATE 4 MG/ML IJ SOLN
4.0000 mg | Freq: Once | INTRAMUSCULAR | Status: AC
  Administered 2012-11-04: 4 mg via INTRAVENOUS
  Filled 2012-11-04: qty 1

## 2012-11-04 MED ORDER — ONDANSETRON HCL 4 MG/2ML IJ SOLN
4.0000 mg | Freq: Once | INTRAMUSCULAR | Status: AC
  Administered 2012-11-04: 4 mg via INTRAVENOUS
  Filled 2012-11-04: qty 2

## 2012-11-04 NOTE — ED Notes (Signed)
Pt sts he had an irregular ultrasound of his gallbladder about 3 weeks ago.  Pt sts he had a fever last night and in experiencing abd pain(upper right quad). Denies n/v

## 2012-11-04 NOTE — ED Provider Notes (Signed)
Medical screening examination/treatment/procedure(s) were performed by non-physician practitioner and as supervising physician I was immediately available for consultation/collaboration.  Tobin Chad, MD 11/04/12 1352

## 2012-11-04 NOTE — ED Provider Notes (Signed)
History     CSN: [id number]  Arrival date & time 11/04/12  4034   First MD Initiated Contact with Patient 11/04/12 [phone]      Chief Complaint  Patient presents with  . Abdominal Pain    (Consider location/radiation/quality/duration/timing/severity/associated sxs/prior treatment) HPI Comments: 31 year old male with a history of HIV, GERD & stomach ulcer presents emergency department complaining of right upper quadrant and epigastric abdominal pain.  Onset of symptoms began Saturday evening and has been gradually worsening. Pain is moderate and does not radiate. No known exacerbating or alleviating factors. Associated symptoms include nausea, soft stools, fever of 101 last night, and night sweats. Patient denies any emesis, chest pain, shortness of breath, dyspnea on exertion, leg swelling, melena, hematochezia,   In reviewing pts medical records he was evaluated on 10/14/2012 complaining up similar symptoms that had been going on at that time for weeks and an [place] was don't as below on 11/1. It appear pts symptom onset is more accurately 1 month ago.   PCP: Merdis Delay [place], [place]), compliant on medications Patient HIV status as 10/9 with CD4 ct 569 and VL<50.   US ABDOMEN COMPLETE (10/02/2012 12:04 PM EDT) Impressions  1. Mild hepatic steatosis.   2. Small volume of gallbladder sludge, no evidence of cholelithiasis or acute cholecystitis.     Patient is a 31 y.o. male presenting with abdominal pain. The history is provided by the patient and medical records.  Abdominal Pain The primary symptoms of the illness include abdominal pain, fever, nausea and diarrhea ("softer then normal"). The primary symptoms of the illness do not include shortness of breath, vomiting or dysuria.  Symptoms associated with the illness do not include chills, constipation, urgency or hematuria.    Past Medical History  Diagnosis Date  . HIV disease   . GERD (gastroesophageal reflux disease)   .  Ulcer     Past Surgical History  Procedure Date  . Wisdom tooth extraction     Family History  Problem Relation Age of Onset  . Cancer Mother     colon  . Diabetes Father   . Hypertension Father   . Diabetes Sister   . Hypertension Sister   . Psoriasis Maternal Grandmother   . Hypertension Maternal Grandfather   . Cancer Maternal Grandfather     lung  . Diabetes Maternal Grandfather   . Diabetes Paternal Grandmother   . Alzheimer's disease Paternal Grandmother   . Cancer Paternal Grandfather     lung    History  Substance Use Topics  . Smoking status: Current Every Day Smoker  . Smokeless tobacco: Not on file  . Alcohol Use: Yes     Comment: occasional      Review of Systems  Constitutional: Positive for fever and appetite change. Negative for chills.  HENT: Negative for neck stiffness and dental problem.   Eyes: Negative for visual disturbance.  Respiratory: Negative for cough, chest tightness, shortness of breath and wheezing.   Cardiovascular: Negative for chest pain.  Gastrointestinal: Positive for nausea, abdominal pain and diarrhea ("softer then normal"). Negative for vomiting, constipation, blood in stool, abdominal distention, anal bleeding and rectal pain.  Genitourinary: Negative for dysuria, urgency, hematuria and flank pain.  Musculoskeletal: Negative for myalgias and arthralgias.  Skin: Negative for rash.  Neurological: Negative for dizziness, syncope, speech difficulty, numbness and headaches.  Hematological: Does not bruise/bleed easily.  All other systems reviewed and are negative.    Allergies  Ciprofloxacin  Home Medications  Current Outpatient Rx  Name  Route  Sig  Dispense  Refill  . DARUNAVIR ETHANOLATE 400 MG PO TABS   Oral   Take 800 mg by mouth daily. Through a study at [facility]         . EMTRICITABINE-TENOFOVIR 200-300 MG PO TABS   Oral   Take 1 tablet by mouth daily. Through a study at [facility]          . IBUPROFEN 200 MG PO TABS   Oral   Take 600 mg by mouth every 6 (six) hours as needed. For pain         . OMEPRAZOLE 20 MG PO CPDR   Oral   Take 20 mg by mouth daily.         . STUDY MEDICATION   Oral   Take 1 tablet by mouth daily. Cobicistat 150 mg through [facility]           BP 121/74  Pulse 88  Temp 98.6 F (37 C) (Oral)  Resp 18  SpO2 96%  Physical Exam  Nursing note and vitals reviewed. Constitutional: Vital signs are normal. He appears well-developed and well-nourished. No distress.  HENT:  Head: Normocephalic and atraumatic.  Mouth/Throat: Uvula is midline, oropharynx is clear and moist and mucous membranes are normal.  Eyes: Conjunctivae normal and EOM are normal. Pupils are equal, round, and reactive to light.  Neck: Normal range of motion and full passive range of motion without pain. Neck supple. No spinous process tenderness and no muscular tenderness present. No rigidity. No Brudzinski's sign noted.  Cardiovascular: Normal rate and regular rhythm.   Pulmonary/Chest: Effort normal and breath sounds normal. No accessory muscle usage. Not tachypneic. No respiratory distress.  Abdominal: Soft. Normal appearance. He exhibits no distension, no ascites, no pulsatile midline mass and no mass. There is tenderness in the right upper quadrant and epigastric area. There is no CVA tenderness. No hernia.    Lymphadenopathy:    He has no cervical adenopathy.  Neurological: He is alert.  Skin: Skin is warm and dry. No rash noted. He is not diaphoretic.  Psychiatric: He has a normal mood and affect. His speech is normal and behavior is normal.    ED Course  Procedures (including critical care time)  Labs Reviewed  URINALYSIS, ROUTINE W REFLEX MICROSCOPIC - Abnormal; Notable for the following:    Color, Urine AMBER (*)  BIOCHEMICALS MAY BE AFFECTED BY COLOR   Bilirubin Urine SMALL (*)     Urobilinogen, UA 2.0 (*)     All other components  within normal limits  CBC WITH DIFFERENTIAL - Abnormal; Notable for the following:    Hemoglobin 12.1 (*)     HCT 36.7 (*)     All other components within normal limits  POCT I-STAT TROPONIN I  COMPREHENSIVE METABOLIC PANEL  LIPASE, BLOOD   US Abdomen Complete  11/04/2012  *RADIOLOGY REPORT*  Clinical Data:  Epigastric abdominal pain.  COMPLETE ABDOMINAL ULTRASOUND  Comparison:  05/07/2010.  Findings:  Gallbladder:  No gallstones, gallbladder wall thickening, or pericholecystic fluid.  Common bile duct:  Normal in caliber measuring a maximum of 3.32mm.  Liver:  The liver is sonographically unremarkable.  There is normal echogenicity without focal lesions or intrahepatic biliary dilatation.  .  IVC:  Normal caliber.  Pancreas:  Sonographically normal.  Spleen:  Normal size and echogenicity without focal lesions.  Right Kidney:  10.1 cm in length. Normal renal cortical  thickness and echogenicity without focal lesions or hydronephrosis.  Left Kidney:  10.2 cm in length. Normal renal cortical thickness and echogenicity without focal lesions or hydronephrosis.  Abdominal aorta:  Normal caliber.  IMPRESSION: Unremarkable abdominal ultrasound examination.   Original Report Authenticated By: Rudie Meyer, M.D.      No diagnosis found.   BP 121/74  Pulse 88  Temp 98.6 F (37 C) (Oral)  Resp 18  SpO2 96%   MDM  RUQ & Epi abd pain  31 year old male with a history of HIV, GERD and stomach ulcers presents emergency department complaining of primarily epigastric and right upper quadrant abdominal pain.  Ultrasound reviewed without acute abnormalities.  Patient has scheduled a followup appointment with [facility] gastroenterology in February, however well provide local gastroenterologist to see if they can evaluate patient sooner.  Patient has been advised to begin taking a proton pump inhibitor as possible diagnosis of ulcer.  Strict instructions to return for the development of concerning symptoms  including persistent fever, melena, hyperemesis, or worsening abdominal pain.  All results discussed with patient who is agreeable with plan and currently stable for discharge.  Patient is nontoxic or ill appearing prior to discharge.        Jaci Carrel, [place] 11/04/12 1231

## 2012-11-09 ENCOUNTER — Telehealth: Payer: Self-pay

## 2012-11-09 NOTE — Telephone Encounter (Signed)
PT STATES HE WAS GIVEN AN OOW NOTE FOR TUES AND TO RETURN ON Wednesday. HOWEVER HIS JOB WON'T LET HIM COME BACK WITHOUT A NOTE OR A COPY PLEASE CALL [phone]

## 2012-11-09 NOTE — Telephone Encounter (Signed)
Unsure what he needs sent. Left message for him to call me back.

## 2012-11-13 NOTE — Telephone Encounter (Signed)
It is now Friday, he has not called me back about this, so he must no longer need it.

## 2013-02-03 ENCOUNTER — Ambulatory Visit (INDEPENDENT_AMBULATORY_CARE_PROVIDER_SITE_OTHER): Payer: BC Managed Care – PPO | Admitting: Family Medicine

## 2013-02-03 VITALS — BP 118/72 | HR 58 | Temp 97.5°F | Resp 16 | Ht 72.0 in | Wt 178.0 lb

## 2013-02-03 DIAGNOSIS — K5900 Constipation, unspecified: Secondary | ICD-10-CM

## 2013-02-03 DIAGNOSIS — Z21 Asymptomatic human immunodeficiency virus [HIV] infection status: Secondary | ICD-10-CM

## 2013-02-03 DIAGNOSIS — K644 Residual hemorrhoidal skin tags: Secondary | ICD-10-CM

## 2013-02-03 MED ORDER — HYDROCORTISONE ACETATE 25 MG RE SUPP: 25.0000 mg | Freq: Two times a day (BID) | RECTAL | Status: DC

## 2013-02-03 NOTE — Progress Notes (Signed)
[address]   [place], [place]  [address]   [phone]  Subjective:    Patient ID: Dillon Wilson., male    DOB: 10/14/1981, 32 y.o.   MRN: [id number]  HPI This 32 y.o. male presents for evaluation of constipation and hemorrhoids.    1. Hemorrhoid problems: onset one week ago.  Using suppositories preparation H, gel cream and ointment Preparation H.  Salt water baths once every other day.  No improvement.  No recent issues in three years; had visit at [facility] three years ago with thrombosed hemorrhoid.  Worried about recurrent hemorrhoid.  No bleeding.  +itching or burning.  Completed Anusol HC suppositories yesterday.  Mild pain with passing bowel movement. No fever/chills/sweats/malaise.  No dysuria, urgency, frequency, hematuria.  2.  Constipation: stool softeners.  Having bowel movements daily.  Suffering with constipation last week; no fever/chills/sweats. No nausea, vomiting, diarrhea.  No melena or bloody stools.    PCP: [facility] ID:  Janee Morn and ID is Twana Harriston/[facility] every six months.    Review of Systems  Constitutional: Negative for fever, chills, diaphoresis and fatigue.  Gastrointestinal: Positive for constipation and rectal pain. Negative for nausea, vomiting, abdominal pain, diarrhea, blood in stool and anal bleeding.  Genitourinary: Negative for dysuria, frequency, hematuria and flank pain.    Past Medical History  Diagnosis Date  . HIV disease   . GERD (gastroesophageal reflux disease)   . Ulcer     Past Surgical History  Procedure Laterality Date  . Wisdom tooth extraction      Prior to Admission medications   Medication Sig Start Date End Date Taking? Authorizing Provider  darunavir (PREZISTA) 400 MG tablet Take 800 mg by mouth daily. Through a study at [facility]   Yes Historical Provider, MD  emtricitabine-tenofovir (TRUVADA) 200-300 MG per tablet Take 1 tablet by mouth daily. Through a study at [facility]   Yes Historical  Provider, MD  omeprazole (PRILOSEC) 20 MG capsule Take 20 mg by mouth daily.   Yes Historical Provider, MD  STUDY MEDICATION Take 1 tablet by mouth daily. Cobicistat 150 mg through [facility]   Yes Historical Provider, MD  hydrocortisone (ANUSOL-HC) 25 MG suppository Place 1 suppository (25 mg total) rectally 2 (two) times daily. 02/03/13   Ethelda Chick, MD  ibuprofen (ADVIL,MOTRIN) 200 MG tablet Take 600 mg by mouth every 6 (six) hours as needed. For pain    Historical Provider, MD    Allergies  Allergen Reactions  . Ciprofloxacin Diarrhea and Nausea Only    History   Social History  . Marital Status: Single    Spouse Name: N/A    Number of Children: N/A  . Years of Education: N/A   Occupational History  . Not on file.   Social History Main Topics  . Smoking status: Current Every Day Smoker  . Smokeless tobacco: Not on file  . Alcohol Use: Yes     Comment: occasional  . Drug Use: No  . Sexually Active: Yes   Other Topics Concern  . Not on file   Social History Narrative  . No narrative on file    Family History  Problem Relation Age of Onset  . Cancer Mother     colon  . Diabetes Father   . Hypertension Father   . Diabetes Sister   . Hypertension Sister   . Psoriasis Maternal Grandmother   . Hypertension Maternal Grandfather   . Cancer Maternal  Grandfather     lung  . Diabetes Maternal Grandfather   . Diabetes Paternal Grandmother   . Alzheimer's disease Paternal Grandmother   . Cancer Paternal Grandfather     lung       Objective:   Physical Exam  Nursing note and vitals reviewed. Constitutional: He appears well-developed and well-nourished. No distress.  Abdominal: Soft. Bowel sounds are normal. He exhibits no distension and no mass. There is no tenderness. There is no rebound and no guarding.  Genitourinary: Rectal exam shows external hemorrhoid and tenderness. Rectal exam shows no internal hemorrhoid, no fissure, no mass and anal  tone normal.  TTP laterally; no induration; no fluctuants or warmth.  +scattered perianal warts non-tender or inflamed.  No visible anal fissure.  Skin: He is not diaphoretic.        Assessment & Plan:  Hemorrhoids, external  Unspecified constipation  HIV (human immunodeficiency virus infection)    1. Hemorrhoid External with inflammation:  New/recurrent.  No evidence of thrombosis, fissure, rectal abscess.  Rx for Anusol HC suppositories and increase to bid to tid use; increase baths to bid for next week; continue Preparation H.  Continue stool softeners. 2. Constipation:  New. Etiology to worsening hemorrhoids.  Continue stool softeners.  Increase water and fiber intake. 3.  HIV: stable.    Meds ordered this encounter  Medications  . hydrocortisone (ANUSOL-HC) 25 MG suppository    Sig: Place 1 suppository (25 mg total) rectally 2 (two) times daily.    Dispense:  24 suppository    Refill:  2

## 2013-02-03 NOTE — Patient Instructions (Addendum)
Hemorrhoids, external  Unspecified constipation    1.  SUPPOSITORIES TWICE DAILY. 2.  BATHS TWICE DAILY. 3.  CONTINUE STOOL SOFTENER. 4.  RETURN FOR INCREASING PAIN, FEVER.

## 2013-02-07 ENCOUNTER — Ambulatory Visit (INDEPENDENT_AMBULATORY_CARE_PROVIDER_SITE_OTHER): Payer: BC Managed Care – PPO | Admitting: Family Medicine

## 2013-02-07 VITALS — BP 122/78 | HR 61 | Temp 98.1°F | Resp 16 | Ht 72.0 in | Wt 177.6 lb

## 2013-02-07 DIAGNOSIS — K644 Residual hemorrhoidal skin tags: Secondary | ICD-10-CM

## 2013-02-07 DIAGNOSIS — K602 Anal fissure, unspecified: Secondary | ICD-10-CM

## 2013-02-07 MED ORDER — DILTIAZEM GEL 2 %: 1.0000 "application " | Freq: Three times a day (TID) | CUTANEOUS | Status: DC

## 2013-02-07 MED ORDER — LIDOCAINE HCL 2 % EX GEL: CUTANEOUS | Status: DC | PRN

## 2013-02-07 MED ORDER — KETOROLAC TROMETHAMINE 60 MG/2ML IM SOLN
60.0000 mg | Freq: Once | INTRAMUSCULAR | Status: AC
  Administered 2013-02-07: 60 mg via INTRAMUSCULAR

## 2013-02-07 NOTE — Progress Notes (Signed)
Subjective:    Patient ID: Dillon Wilson., male    DOB: 25-Jan-1981, 32 y.o.   MRN: [id number] Chief Complaint  Patient presents with  . Hemorrhoids   HPI  Dillon Wilson is a 32 yo male with a problems with chronic constipation and recurrent problem with hemorrhoids.  He has been using a wide variety of otc meds w/o improvement and was seen in clinic several days ago and put on Anusol [facility] suppositories but didn't really help. He used the anusol suppositories up to today but had severe symptoms after.  He has noted that there is a firm lump and so he is absolutely sure that his hemorrhoids is worse and needs to be cut out - he is having such severe pain he can't even sit, having trouble sleeping due to pain.  His bowel movements are painful but there is no bleeding. He is able to manage his constipation.  Past Medical History  Diagnosis Date  . HIV disease   . GERD (gastroesophageal reflux disease)   . Ulcer    Current Outpatient Prescriptions on File Prior to Visit  Medication Sig Dispense Refill  . darunavir (PREZISTA) 400 MG tablet Take 800 mg by mouth daily. Through a study at [facility]      . emtricitabine-tenofovir (TRUVADA) 200-300 MG per tablet Take 1 tablet by mouth daily. Through a study at [facility]      . hydrocortisone (ANUSOL-HC) 25 MG suppository Place 1 suppository (25 mg total) rectally 2 (two) times daily.  24 suppository  2  . ibuprofen (ADVIL,MOTRIN) 200 MG tablet Take 600 mg by mouth every 6 (six) hours as needed. For pain      . STUDY MEDICATION Take 1 tablet by mouth daily. Cobicistat 150 mg through [facility]       No current facility-administered medications on file prior to visit.   Allergies  Allergen Reactions  . Ciprofloxacin Diarrhea and Nausea Only  . Dapsone Other (See Comments)    G6PD deficiency  . Primaquine     Other reaction(s): Other (See Comments) G6PD deficiency   Review of Systems  Gastrointestinal:  Positive for constipation and rectal pain. Negative for nausea, abdominal pain, diarrhea, blood in stool, abdominal distention and anal bleeding.  Skin: Negative for color change and rash.  Hematological: Negative for adenopathy. Does not bruise/bleed easily.  Psychiatric/Behavioral: Positive for sleep disturbance.      BP 122/78  Pulse 61  Temp(Src) 98.1 F (36.7 C) (Oral)  Resp 16  Ht 6' (1.829 m)  Wt 177 lb 9.6 oz (80.559 kg)  BMI 24.08 kg/m2  SpO2 100% Objective:   Physical Exam  Constitutional: He is oriented to person, place, and time. He appears well-developed and well-nourished. No distress.  HENT:  Head: Normocephalic and atraumatic.  Eyes: No scleral icterus.  Pulmonary/Chest: Effort normal.  Genitourinary: Rectal exam shows external hemorrhoid, fissure and tenderness.     3 small soft exquisitely tender external hemorrhoids palpable - pea-sized, nonthrombosed.  A fissure is present in-between the 2 posterior hemorrhoids on the right.  Neurological: He is alert and oriented to person, place, and time.  Skin: Skin is warm and dry. He is not diaphoretic.  Psychiatric: He has a normal mood and affect. His behavior is normal.      Assessment & Plan:  External hemorrhoid - Plan: ketorolac (TORADOL) injection 60 mg IM x 1 in office now, lidocaine (XYLOCAINE JELLY) 2 % jelly topically - applied  in office now and rx given to fill , Ambulatory referral to General Surgery - hopefully we can get pt in to their acute walk-in clinic surgical clinic tomorrow as he is in SEVERE pain despite the in-office meds given and he would like his hemorrhoids definitely treated ASAP.  Anal fissure - start top dilt gel with prn xylocaine, lots of sitz baths - 2 handouts given  Meds ordered this encounter  Medications  . ketorolac (TORADOL) injection 60 mg    Sig:   . lidocaine (XYLOCAINE JELLY) 2 % jelly    Sig: Apply topically as needed.    Dispense:  30 mL    Refill:  0  . omeprazole  (PRILOSEC) 20 MG capsule    Sig: 20 mg. Take 1 capsule (20 mg total) by mouth daily.  Marland Kitchen acetaminophen (TYLENOL) 325 MG tablet    Sig: 650 mg. Take 650 mg by mouth every 6 (six) hours as needed.  . diltiazem 2 % GEL    Sig: Apply 1 application topically 3 (three) times daily.    Dispense:  30 g    Refill:  1   Norberto Sorenson, MD MPH

## 2013-02-07 NOTE — Patient Instructions (Signed)
Anal Fissure, Adult  An anal fissure is a small tear or crack in the skin around the anus. Bleeding from a fissure usually stops on its own within a few minutes. However, bleeding will often reoccur with each bowel movement until the crack heals.   CAUSES    Passing large, hard stools.   Frequent diarrheal stools.   Constipation.   Inflammatory bowel disease (Crohn's disease or ulcerative colitis).   Infections.   Anal sex.  SYMPTOMS    Small amounts of blood seen on your stools, on toilet paper, or in the toilet after a bowel movement.   Rectal bleeding.   Painful bowel movements.   Itching or irritation around the anus.  DIAGNOSIS  Your caregiver will examine the anal area. An anal fissure can usually be seen with careful inspection. A rectal exam may be performed and a short tube (anoscope) may be used to examine the anal canal.  TREATMENT    You may be instructed to take fiber supplements. These supplements can soften your stool to help make bowel movements easier.   Sitz baths may be recommended to help heal the tear. Do not use soap in the sitz baths.   A medicated cream or ointment may be prescribed to lessen discomfort.  HOME CARE INSTRUCTIONS    Maintain a diet high in fruits, whole grains, and vegetables. Avoid constipating foods like bananas and dairy products.   Take sitz baths as directed by your caregiver.   Drink enough fluids to keep your urine clear or pale yellow.   Only take over-the-counter or prescription medicines for pain, discomfort, or fever as directed by your caregiver. Do not take aspirin as this may increase bleeding.   Do not use ointments containing numbing medications (anesthetics) or hydrocortisone. They could slow healing.  SEEK MEDICAL CARE IF:    Your fissure is not completely healed within 3 days.   You have further bleeding.   You have a fever.   You have diarrhea mixed with blood.   You have pain.   Your problem is getting worse rather than  better.  MAKE SURE YOU:    Understand these instructions.   Will watch your condition.   Will get help right away if you are not doing well or get worse.  Document Released: 11/18/2005 Document Revised: 02/10/2012 Document Reviewed: 05/05/2011  [employer] Patient Information 2013 [employer].

## 2013-02-10 ENCOUNTER — Ambulatory Visit (INDEPENDENT_AMBULATORY_CARE_PROVIDER_SITE_OTHER): Payer: BC Managed Care – PPO | Admitting: General Surgery

## 2013-02-10 ENCOUNTER — Encounter (INDEPENDENT_AMBULATORY_CARE_PROVIDER_SITE_OTHER): Payer: Self-pay | Admitting: General Surgery

## 2013-02-10 VITALS — BP 130/72 | HR 56 | Temp 98.2°F | Resp 20 | Ht 73.0 in | Wt 182.0 lb

## 2013-02-10 DIAGNOSIS — K644 Residual hemorrhoidal skin tags: Secondary | ICD-10-CM | POA: Insufficient documentation

## 2013-02-10 DIAGNOSIS — K602 Anal fissure, unspecified: Secondary | ICD-10-CM

## 2013-02-10 NOTE — Progress Notes (Signed)
Patient ID: Dillon Wilson., male   DOB: 08/07/1981, 32 y.o.   MRN: [id number]  Chief Complaint  Patient presents with  . Hemorrhoids  . Rectal Pain    HPI Dillon Wilson. is a 32 y.o. male.   HPI 32 year old African American male with HIV disease referred by Dr. Clelia Croft for evaluation of an anal fissure and external hemorrhoids. The patient states he started having problems about 2 weeks ago.  He denies pain while having a bowel movement. But immediately after having a bowel movement he describes it as a pins sticking sensation as well as a burning sensation that lasts for several minutes. He says it is quite intense.he thinks it is getting worse. He states he has had something similar in the past about 3 years ago. He says at that time he had an external hemorrhoid removed in an urgent office setting. He has been using sitz baths, perforation, and Anusol suppositories. He states that he only gets relief with Anusol suppositories.he states his symptoms began about 2 weeks ago after having anal intercourse. He states he normally has a bowel movement every day. However he does, note for 10-20 minutes at a time. He denies straining with defecation. He drinks 3-4 glasses of water a day. He denies any melena or hematochezia. He denies any bleeding. Denies any fever, chills, weight loss. He denies any dysuria. Past Medical History  Diagnosis Date  . HIV disease   . GERD (gastroesophageal reflux disease)   . Ulcer     Past Surgical History  Procedure Laterality Date  . Wisdom tooth extraction      Family History  Problem Relation Age of Onset  . Cancer Mother     colon  . Diabetes Father   . Hypertension Father   . Diabetes Sister   . Hypertension Sister   . Psoriasis Maternal Grandmother   . Hypertension Maternal Grandfather   . Cancer Maternal Grandfather     lung  . Diabetes Maternal Grandfather   . Diabetes Paternal Grandmother   . Alzheimer's disease Paternal Grandmother   .  Cancer Paternal Grandfather     lung    Social History History  Substance Use Topics  . Smoking status: Current Every Day Smoker  . Smokeless tobacco: Not on file  . Alcohol Use: Yes     Comment: 2 x week    Allergies  Allergen Reactions  . Ciprofloxacin Diarrhea and Nausea Only  . Dapsone Other (See Comments)    G6PD deficiency  . Primaquine     Other reaction(s): Other (See Comments) G6PD deficiency    Current Outpatient Prescriptions  Medication Sig Dispense Refill  . darunavir (PREZISTA) 400 MG tablet Take 800 mg by mouth daily. Through a study at [facility]      . diltiazem 2 % GEL Apply 1 application topically 3 (three) times daily.  30 g  1  . emtricitabine-tenofovir (TRUVADA) 200-300 MG per tablet Take 1 tablet by mouth daily. Through a study at [facility]      . hydrocortisone (ANUSOL-HC) 25 MG suppository Place 1 suppository (25 mg total) rectally 2 (two) times daily.  24 suppository  2  . ibuprofen (ADVIL,MOTRIN) 200 MG tablet Take 600 mg by mouth every 6 (six) hours as needed. For pain      . omeprazole (PRILOSEC) 20 MG capsule 20 mg. Take 1 capsule (20 mg total) by mouth daily.      . STUDY MEDICATION Take  1 tablet by mouth daily. Cobicistat 150 mg through [facility]       No current facility-administered medications for this visit.    Review of Systems Review of Systems  Constitutional: Negative for fever, chills, appetite change and unexpected weight change.  HENT: Negative for congestion and trouble swallowing.   Eyes: Negative for visual disturbance.  Respiratory: Negative for chest tightness and shortness of breath.   Cardiovascular: Negative for chest pain and leg swelling.       No PND, no orthopnea, no DOE  Gastrointestinal:       See HPI  Genitourinary: Negative for dysuria and hematuria.  Musculoskeletal: Negative.   Skin: Negative for rash.  Neurological: Negative for seizures and speech difficulty.   Hematological: Does not bruise/bleed easily.  Psychiatric/Behavioral: Negative for behavioral problems and confusion.    Blood pressure 130/72, pulse 56, temperature 98.2 F (36.8 C), temperature source Temporal, resp. rate 20, height 6\' 1"  (1.854 m), weight 182 lb (82.555 kg).  Physical Exam Physical Exam  Vitals reviewed. Constitutional: He is oriented to person, place, and time. He appears well-developed and well-nourished. No distress.  HENT:  Head: Normocephalic and atraumatic.  Right Ear: External ear normal.  Eyes: Conjunctivae are normal. No scleral icterus.  Neck: No tracheal deviation present.  Cardiovascular: Normal rate, regular rhythm and normal heart sounds.   Pulmonary/Chest: Effort normal and breath sounds normal. No stridor. No respiratory distress. He has no wheezes.  Abdominal: Soft. Bowel sounds are normal. He exhibits no distension. There is no tenderness. There is no rebound and no guarding.  Genitourinary: Penis normal. Rectal exam shows external hemorrhoid and fissure.  Posterior midline fissure. ?sentinel pile in post midline. Small left and right lateral ext hemorrhoid tissue - NON-thrombosed. dre and anoscopy deferred secondary to presence of fissure. 3 small warts (<29mm)  Musculoskeletal: He exhibits no edema.  Neurological: He is alert and oriented to person, place, and time.  Skin: Skin is warm and dry. No rash noted. He is not diaphoretic. No erythema.  Psychiatric: He has a normal mood and affect. His behavior is normal. Judgment and thought content normal.    Data Reviewed Dr Michaelle Copas note Dr Alver Fisher note  Assessment    Anal fissure External Hemorrhoids  Condyloma    Plan    I believe the majority of his pain and discomfort is coming from his anal fissure and not his nonthrombosed external hemorrhoidal tissue. I see no benefit of excising his nonthrombosed external hemorrhoid tissue at the current time given his current anal fissure.  We  discussed the etiology of anal fissures. The patient was given educational material as well as diagrams. We discussed nonoperative and operative management of anal fissures.  We discussed nonoperative management including correcting underlying bowel habits such as constipation, avoiding bathroom reading, avoiding straining with defecation. We also discussed the use of topical ointments such as nifedipine and diltiazem ointment. We also discussed the use of Botox injection.  My recommendation was to start with non-operative management first.  The patient has elected To start with nonoperative management. I instructed him to get his diltiazem ointment that he was prescribed the other day and start using it. I told him to stop using any suppositories or ointments  That has hydrocortisone ointment. I instructed him to stop sitting, and for 10-20 minutes at a time. I encouraged him to increase his water and fiber intake. We discussed the importance of having a daily bowel movement.  Followup 6-8 weeks  Dillon Sella. Andrey Campanile, MD, FACS General, Bariatric, & Minimally Invasive Surgery [facility], [place]          [facility] M 02/10/2013, 10:44 AM

## 2013-02-10 NOTE — Patient Instructions (Signed)
Stop using ointments with HC (hydrocortisone) Start using diltiazem ointment as prescribed Starting drinking 6-8 glasses of water per day Increase the fiber in your diet Don't sit on the commode for longer than 10 minutes at a time Anal Fissure, Adult An anal fissure is a small tear or crack in the skin around the anus. Bleeding from a fissure usually stops on its own within a few minutes. However, bleeding will often reoccur with each bowel movement until the crack heals.  CAUSES   Passing large, hard stools.  Frequent diarrheal stools.  Constipation.  Inflammatory bowel disease (Crohn's disease or ulcerative colitis).  Infections.  Anal sex. SYMPTOMS   Small amounts of blood seen on your stools, on toilet paper, or in the toilet after a bowel movement.  Rectal bleeding.  Painful bowel movements.  Itching or irritation around the anus. DIAGNOSIS Your caregiver will examine the anal area. An anal fissure can usually be seen with careful inspection. A rectal exam may be performed and a short tube (anoscope) may be used to examine the anal canal. TREATMENT   You may be instructed to take fiber supplements. These supplements can soften your stool to help make bowel movements easier.  Sitz baths may be recommended to help heal the tear. Do not use soap in the sitz baths.  A medicated cream or ointment may be prescribed to lessen discomfort. HOME CARE INSTRUCTIONS   Maintain a diet high in fruits, whole grains, and vegetables. Avoid constipating foods like bananas and dairy products.  Take sitz baths as directed by your caregiver.  Drink enough fluids to keep your urine clear or pale yellow.  Only take over-the-counter or prescription medicines for pain, discomfort, or fever as directed by your caregiver. Do not take aspirin as this may increase bleeding.  Do not use ointments containing numbing medications (anesthetics) or hydrocortisone. They could slow healing. SEEK  MEDICAL CARE IF:   Your fissure is not completely healed within 3 days.  You have further bleeding.  You have a fever.  You have diarrhea mixed with blood.  You have pain.  Your problem is getting worse rather than better. MAKE SURE YOU:   Understand these instructions.  Will watch your condition.  Will get help right away if you are not doing well or get worse. Document Released: 11/18/2005 Document Revised: 02/10/2012 Document Reviewed: 05/05/2011 [facility] Patient Information 2013 [place], [place].  GETTING TO GOOD BOWEL HEALTH. Irregular bowel habits such as constipation and diarrhea can lead to many problems over time.  Having one soft bowel movement a day is the most important way to prevent further problems.  The anorectal canal is designed to handle stretching and feces to safely manage our ability to get rid of solid waste (feces, poop, stool) out of our body.  BUT, hard constipated stools can act like ripping concrete bricks and diarrhea can be a burning fire to this very sensitive area of our body, causing inflamed hemorrhoids, anal fissures, increasing risk is perirectal abscesses, abdominal pain/bloating, an making irritable bowel worse.     The goal: ONE SOFT BOWEL MOVEMENT A DAY!  To have soft, regular bowel movements:    Drink at least 8 tall glasses of water a day.     Take plenty of fiber.  Fiber is the undigested part of plant food that passes into the colon, acting s "natures broom" to encourage bowel motility and movement.  Fiber can absorb and hold large amounts of water. This results in a  larger, bulkier stool, which is soft and easier to pass. Work gradually over several weeks up to 6 servings a day of fiber (25g a day even more if needed) in the form of: o Vegetables -- Root (potatoes, carrots, turnips), leafy green (lettuce, salad greens, celery, spinach), or cooked high residue (cabbage, broccoli, etc) o Fruit -- Fresh (unpeeled skin & pulp), Dried (prunes,  apricots, cherries, etc ),  or stewed ( applesauce)  o Whole grain breads, pasta, etc (whole wheat)  o Bran cereals    Bulking Agents -- This type of water-retaining fiber generally is easily obtained each day by one of the following:  o Psyllium bran -- The psyllium plant is remarkable because its ground seeds can retain so much water. This product is available as Metamucil, Konsyl, Effersyllium, Per Diem Fiber, or the less expensive generic preparation in drug and health food stores. Although labeled a laxative, it really is not a laxative.  o Methylcellulose -- This is another fiber derived from wood which also retains water. It is available as Citrucel. o Polyethylene Glycol - and "artificial" fiber commonly called Miralax or Glycolax.  It is helpful for people with gassy or bloated feelings with regular fiber o Flax Seed - a less gassy fiber than psyllium   No reading or other relaxing activity while on the toilet. If bowel movements take longer than 5 minutes, you are too constipated   AVOID CONSTIPATION.  High fiber and water intake usually takes care of this.  Sometimes a laxative is needed to stimulate more frequent bowel movements, but    Laxatives are not a good long-term solution as it can wear the colon out. o Osmotics (Milk of Magnesia, Fleets phosphosoda, Magnesium citrate, MiraLax, GoLytely) are safer than  o Stimulants (Senokot, Castor Oil, Dulcolax, Ex Lax)    o Do not take laxatives for more than 7days in a row.    IF SEVERELY CONSTIPATED, try a Bowel Retraining Program: o Do not use laxatives.  o Eat a diet high in roughage, such as bran cereals and leafy vegetables.  o Drink six (6) ounces of prune or apricot juice each morning.  o Eat two (2) large servings of stewed fruit each day.  o Take one (1) heaping tablespoon of a psyllium-based bulking agent twice a day. Use sugar-free sweetener when possible to avoid excessive calories.  o Eat a normal breakfast.  o Set aside 15  minutes after breakfast to sit on the toilet, but do not strain to have a bowel movement.  o If you do not have a bowel movement by the third day, use an enema and repeat the above steps.  o

## 2013-02-11 ENCOUNTER — Ambulatory Visit (INDEPENDENT_AMBULATORY_CARE_PROVIDER_SITE_OTHER): Payer: BC Managed Care – PPO | Admitting: Family Medicine

## 2013-02-11 ENCOUNTER — Encounter: Payer: Self-pay | Admitting: Family Medicine

## 2013-02-11 VITALS — BP 144/68 | HR 78 | Temp 98.4°F | Resp 16 | Ht 73.0 in | Wt 182.0 lb

## 2013-02-11 DIAGNOSIS — K602 Anal fissure, unspecified: Secondary | ICD-10-CM

## 2013-02-11 MED ORDER — DOXYCYCLINE HYCLATE 100 MG PO TABS: 100.0000 mg | ORAL_TABLET | Freq: Two times a day (BID) | ORAL | Status: DC

## 2013-02-11 MED ORDER — LIDOCAINE 5 % EX OINT: TOPICAL_OINTMENT | CUTANEOUS | Status: DC | PRN

## 2013-02-11 NOTE — Patient Instructions (Signed)
Anal Fissure, Adult  An anal fissure is a small tear or crack in the skin around the anus. Bleeding from a fissure usually stops on its own within a few minutes. However, bleeding will often reoccur with each bowel movement until the crack heals.   CAUSES    Passing large, hard stools.   Frequent diarrheal stools.   Constipation.   Inflammatory bowel disease (Crohn's disease or ulcerative colitis).   Infections.   Anal sex.  SYMPTOMS    Small amounts of blood seen on your stools, on toilet paper, or in the toilet after a bowel movement.   Rectal bleeding.   Painful bowel movements.   Itching or irritation around the anus.  DIAGNOSIS  Your caregiver will examine the anal area. An anal fissure can usually be seen with careful inspection. A rectal exam may be performed and a short tube (anoscope) may be used to examine the anal canal.  TREATMENT    You may be instructed to take fiber supplements. These supplements can soften your stool to help make bowel movements easier.   Sitz baths may be recommended to help heal the tear. Do not use soap in the sitz baths.   A medicated cream or ointment may be prescribed to lessen discomfort.  HOME CARE INSTRUCTIONS    Maintain a diet high in fruits, whole grains, and vegetables. Avoid constipating foods like bananas and dairy products.   Take sitz baths as directed by your caregiver.   Drink enough fluids to keep your urine clear or pale yellow.   Only take over-the-counter or prescription medicines for pain, discomfort, or fever as directed by your caregiver. Do not take aspirin as this may increase bleeding.   Do not use ointments containing numbing medications (anesthetics) or hydrocortisone. They could slow healing.  SEEK MEDICAL CARE IF:    Your fissure is not completely healed within 3 days.   You have further bleeding.   You have a fever.   You have diarrhea mixed with blood.   You have pain.   Your problem is getting worse rather than  better.  MAKE SURE YOU:    Understand these instructions.   Will watch your condition.   Will get help right away if you are not doing well or get worse.  Document Released: 11/18/2005 Document Revised: 02/10/2012 Document Reviewed: 05/05/2011  [employer] Patient Information 2013 [employer].

## 2013-02-11 NOTE — Progress Notes (Signed)
32 yo with 2.5 weeks of anal pain, some bleeding.  Saw surgeon yesterday who had nothing to offer because he has a fissure and no hemorrhoid.  Patient has tried anusol suppos, Prep H, sitz baths.  Objective:  Anal fissure with sentinel tag.  No perianal or perineal swelling or fluctuance. I spent considerable amount of time(1/2 hour) face to face explaining that he does not need surgery or lancing.  Instead, local skin care is indicated.  Assessment:  Anal fissure  Plan:   Anal fissure - Plan: lidocaine (XYLOCAINE) 5 % ointment, doxycycline (VIBRA-TABS) 100 MG tablet  Continue sitz baths

## 2013-04-07 ENCOUNTER — Encounter (INDEPENDENT_AMBULATORY_CARE_PROVIDER_SITE_OTHER): Payer: BC Managed Care – PPO | Admitting: General Surgery

## 2013-11-08 ENCOUNTER — Ambulatory Visit (INDEPENDENT_AMBULATORY_CARE_PROVIDER_SITE_OTHER): Payer: BC Managed Care – PPO | Admitting: Physician Assistant

## 2013-11-08 VITALS — BP 118/70 | HR 96 | Temp 98.3°F | Resp 16 | Ht 72.0 in | Wt 182.0 lb

## 2013-11-08 DIAGNOSIS — A545 Gonococcal pharyngitis: Secondary | ICD-10-CM

## 2013-11-08 MED ORDER — CEFTRIAXONE SODIUM 1 G IJ SOLR
250.0000 mg | Freq: Once | INTRAMUSCULAR | Status: AC
  Administered 2013-11-08: 250 mg via INTRAMUSCULAR

## 2013-11-08 MED ORDER — AZITHROMYCIN 500 MG PO TABS
1000.0000 mg | ORAL_TABLET | Freq: Once | ORAL | Status: DC
Start: 2013-11-08 — End: 2014-01-23

## 2013-11-08 NOTE — Progress Notes (Signed)
   Subjective:    Patient ID: Cristino Degroff., male    DOB: 1980/12/08, 32 y.o.   MRN: [id number]  HPI   Mr. Wamble is a very pleasant 32 yr old male here "for treatment."  Reports that he was tested for STDs at [facility].  Tested with oral, anal, and genital swabs - oral swab +gonorrhea.  He denies symptoms.  Reports that his partner is also here for treatment/testing.  Pt is HIV positive and compliant with medications.  Followed at [facility].  He feels well today.  Review of Systems  HENT: Negative for trouble swallowing.   Respiratory: Negative.   Cardiovascular: Negative.   Gastrointestinal: Negative.   Genitourinary: Negative for dysuria, discharge and testicular pain.  Musculoskeletal: Negative.   Skin: Negative.        Objective:   Physical Exam  Vitals reviewed. Constitutional: He is oriented to person, place, and time. He appears well-developed and well-nourished. No distress.  HENT:  Head: Normocephalic and atraumatic.  Mouth/Throat: Uvula is midline, oropharynx is clear and moist and mucous membranes are normal.  Eyes: Conjunctivae are normal. No scleral icterus.  Neck: Neck supple.  Cardiovascular: Normal rate, regular rhythm and normal heart sounds.   Pulmonary/Chest: Effort normal and breath sounds normal. He has no wheezes. He has no rales.  Lymphadenopathy:    He has no cervical adenopathy.  Neurological: He is alert and oriented to person, place, and time.  Skin: Skin is warm and dry.  Psychiatric: He has a normal mood and affect. His behavior is normal.        Assessment & Plan:  Gonococcal pharyngitis, male - Plan: cefTRIAXone (ROCEPHIN) injection 250 mg, azithromycin (ZITHROMAX) 500 MG tablet   Mr. Nims is a very pleasant 32 yr old male here for treatment of pharyngeal gonorrhea.  Treated with ceftriaxone IM and azithromycin PO.  Instructed pt to abstain from sexual activity for the next 7 days to allow infection to clear.   Encouraged condom use with every sexual encounter.  Pt to RTC as concerns arise.   Meds ordered this encounter  Medications  . cefTRIAXone (ROCEPHIN) injection 250 mg    Sig:   . azithromycin (ZITHROMAX) 500 MG tablet    Sig: Take 2 tablets (1,000 mg total) by mouth once.    Dispense:  2 tablet    Refill:  0    Order Specific Question:  Supervising Provider    Answer:  Nilda Simmer M [[id number]]    Loleta Dicker MHS, PA-C [facility] 12/8/20149:49 PM  .

## 2013-11-08 NOTE — Patient Instructions (Signed)
We have given you an injection of antibiotics today.  Take the two pills that were sent your pharmacy at once today.  Abstain from sexual activity for the next 7 days to avoid passing the infection back and forth between you and your partner.  Use condoms with every sexual encounter to protect yourself from infection   Safe Sex Safe sex is about reducing the risk of giving or getting a sexually transmitted disease (STD). STDs are spread through sexual contact involving the genitals, mouth, or rectum. Some STDS can be cured and others cannot. Safe sex can also prevent unintended pregnancies.  SAFE SEX PRACTICES  Limit your sexual activity to only one partner who is only having sex with you.  Talk to your partner about their past partners, past STDs, and drug use.  Use a condom every time you have sexual intercourse. This includes vaginal, oral, and anal sexual activity. Both females and males should wear condoms during oral sex. Only use latex or polyurethane condoms and water-based lubricants. Petroleum-based lubricants or oils used to lubricate a condom will weaken the condom and increase the chance that it will break. The condom should be in place from the beginning to the end of sexual activity. Wearing a condom reduces, but does not completely eliminate, your risk of getting or giving a STD. STDs can be spread by contact with skin of surrounding areas.  Get vaccinated for hepatitis B and HPV.  Avoid alcohol and recreational drugs which can affect your judgement. You may forget to use a condom or participate in high-risk sex.  For females, avoid douching after sexual intercourse. Douching can spread an infection farther into the reproductive tract.  Check your body for signs of sores, blisters, rashes, or unusual discharge. See your caregiver if you notice any of these signs.  Avoid sexual contact if you have symptoms of an infection or are being treated for an STD. If you or your partner has  herpes, avoid sexual contact when blisters are present. Use condoms at all other times.  See your caregiver for regular screenings, examinations, and tests for STDs. Before having sex with a new partner, each of you should be screened for STDs and talk about the results with your partner. BENEFITS OF SAFE SEX   There is less of a chance of getting or giving an STD.  You can prevent unwanted or unintended pregnancies.  By discussing safer sex concerns with your partner, you may increase feelings of intimacy, comfort, trust, and honesty between the both of you. Document Released: 12/26/2004 Document Revised: 08/12/2012 Document Reviewed: 05/11/2012 [facility] Patient Information 2014 [place], [place].

## 2014-01-23 ENCOUNTER — Ambulatory Visit (INDEPENDENT_AMBULATORY_CARE_PROVIDER_SITE_OTHER): Payer: BC Managed Care – PPO | Admitting: Internal Medicine

## 2014-01-23 VITALS — BP 110/70 | HR 62 | Temp 98.3°F | Resp 18 | Ht 73.0 in | Wt 181.0 lb

## 2014-01-23 DIAGNOSIS — Z202 Contact with and (suspected) exposure to infections with a predominantly sexual mode of transmission: Secondary | ICD-10-CM

## 2014-01-23 MED ORDER — CEFTRIAXONE SODIUM 1 G IJ SOLR
250.0000 mg | Freq: Once | INTRAMUSCULAR | Status: AC
  Administered 2014-01-23: 250 mg via INTRAMUSCULAR

## 2014-01-23 MED ORDER — AZITHROMYCIN 1 G PO PACK: 1.0000 g | PACK | Freq: Once | ORAL | Status: DC

## 2014-01-23 NOTE — Progress Notes (Signed)
° °  Subjective:    Patient ID: Dillon GuardianLeo T Zeigler Jr., male    DOB: 09/20/1981, 33 y.o.   MRN: [id number]  HPI This chart was scribed for Ellamae Siaobert Doolittle, MD by Andrew Auaven Small, ED Scribe. This patient was seen in room 2 and the patient's care was started at 3:15 PM.  HPI Comments: Dillon GuardianLeo T Bourdon Jr. is a 33 y.o. male who presents to the [facility] complaining of possible exposure to STD. He reports that he has not seen any major symptoms but has felt minor pain. Pt reports that he had a new partner in November 2014 who called and reported positvve GC.  Known HIV pos on meds from [facility]. He reports that his last syphilis test was 3 months ago.   Past Medical History  Diagnosis Date   HIV disease    GERD (gastroesophageal reflux disease)    Ulcer    Allergies  Allergen Reactions   Ciprofloxacin Diarrhea and Nausea Only   Dapsone Other (See Comments)    G6PD deficiency   Primaquine     Other reaction(s): Other (See Comments) G6PD deficiency   Prior to Admission medications   Medication Sig Start Date End Date Taking? Authorizing Provider  omeprazole (PRILOSEC) 20 MG capsule Take 20 mg by mouth daily.   Yes Historical Provider, MD  darunavir (PREZISTA) 400 MG tablet Take 800 mg by mouth daily. [facility]    Historical Provider, MD  emtricitabine-tenofovir (TRUVADA) 200-300 MG per tablet Take 1 tablet by mouth daily. [facility]    Historical Provider, MD  STUDY MEDICATION Take 1 tablet by mouth daily. [facility]obicistat 150 [facility]    Historical Provider, MD   Review of Systems  Genitourinary: Positive for dysuria ( mild). Negative for urgency, frequency, decreased urine volume, discharge, penile swelling, scrotal swelling, difficulty urinating and testicular pain.      Objective:   Physical Exam  Constitutional: He is oriented to person, place, and time. He appears well-developed and  well-nourished. No distress.  HENT:  Head: Normocephalic and atraumatic.  Right Ear: External ear normal.  Eyes: Conjunctivae and EOM are normal. Pupils are equal, round, and reactive to light.  Neck: Neck supple.  Neurological: He is alert and oriented to person, place, and time.  Skin: Skin is warm and dry.      Assessment & Plan:   Meds ordered this encounter  Medications   cefTRIAXone (ROCEPHIN) injection 250 mg    Sig:     Order Specific Question:  Antibiotic Indication:    Answer:  STD   azithromycin (ZITHROMAX) 1 G powder    Sig: Take 1 packet (1 g total) by mouth once.    Dispense:  1 each    Refill:  0    I have completed the patient encounter in its entirety as documented by the scribe, with editing by me where necessary. Robert P. Merla Richesoolittle, M.D.

## 2014-01-26 ENCOUNTER — Telehealth: Payer: Self-pay | Admitting: Radiology

## 2014-01-26 NOTE — Telephone Encounter (Signed)
Patient does not want his lab results mailed to him.

## 2014-01-30 ENCOUNTER — Encounter (HOSPITAL_BASED_OUTPATIENT_CLINIC_OR_DEPARTMENT_OTHER): Payer: Self-pay | Admitting: Emergency Medicine

## 2014-01-30 ENCOUNTER — Emergency Department (HOSPITAL_BASED_OUTPATIENT_CLINIC_OR_DEPARTMENT_OTHER)
Admission: EM | Admit: 2014-01-30 | Discharge: 2014-01-30 | Disposition: A | Discharge status: Home / Self Care | Payer: BC Managed Care – PPO | Attending: Emergency Medicine | Admitting: Emergency Medicine

## 2014-01-30 DIAGNOSIS — Z21 Asymptomatic human immunodeficiency virus [HIV] infection status: Secondary | ICD-10-CM | POA: Insufficient documentation

## 2014-01-30 DIAGNOSIS — R519 Headache, unspecified: Secondary | ICD-10-CM

## 2014-01-30 DIAGNOSIS — Z872 Personal history of diseases of the skin and subcutaneous tissue: Secondary | ICD-10-CM | POA: Insufficient documentation

## 2014-01-30 DIAGNOSIS — R112 Nausea with vomiting, unspecified: Secondary | ICD-10-CM | POA: Insufficient documentation

## 2014-01-30 DIAGNOSIS — R42 Dizziness and giddiness: Secondary | ICD-10-CM | POA: Insufficient documentation

## 2014-01-30 DIAGNOSIS — Z79899 Other long term (current) drug therapy: Secondary | ICD-10-CM | POA: Insufficient documentation

## 2014-01-30 DIAGNOSIS — R51 Headache: Secondary | ICD-10-CM | POA: Insufficient documentation

## 2014-01-30 DIAGNOSIS — R1110 Vomiting, unspecified: Secondary | ICD-10-CM

## 2014-01-30 DIAGNOSIS — E860 Dehydration: Secondary | ICD-10-CM

## 2014-01-30 DIAGNOSIS — K219 Gastro-esophageal reflux disease without esophagitis: Secondary | ICD-10-CM | POA: Insufficient documentation

## 2014-01-30 DIAGNOSIS — R197 Diarrhea, unspecified: Secondary | ICD-10-CM | POA: Insufficient documentation

## 2014-01-30 LAB — CBC WITH DIFFERENTIAL/PLATELET
Basophils Absolute: 0 10*3/uL (ref 0.0–0.1)
Basophils Relative: 0 % (ref 0–1)
EOS ABS: 0 10*3/uL (ref 0.0–0.7)
EOS PCT: 0 % (ref 0–5)
HEMATOCRIT: 40.5 % (ref 39.0–52.0)
Hemoglobin: 13.4 g/dL (ref 13.0–17.0)
LYMPHS ABS: 1.6 10*3/uL (ref 0.7–4.0)
LYMPHS PCT: 25 % (ref 12–46)
MCH: 28.3 pg (ref 26.0–34.0)
MCHC: 33.1 g/dL (ref 30.0–36.0)
MCV: 85.4 fL (ref 78.0–100.0)
MONO ABS: 0.4 10*3/uL (ref 0.1–1.0)
MONOS PCT: 7 % (ref 3–12)
Neutro Abs: 4.3 10*3/uL (ref 1.7–7.7)
Neutrophils Relative %: 68 % (ref 43–77)
Platelets: 163 10*3/uL (ref 150–400)
RBC: 4.74 MIL/uL (ref 4.22–5.81)
RDW: 12.1 % (ref 11.5–15.5)
WBC: 6.4 10*3/uL (ref 4.0–10.5)

## 2014-01-30 LAB — BASIC METABOLIC PANEL
BUN: 11 mg/dL (ref 6–23)
CALCIUM: 9.6 mg/dL (ref 8.4–10.5)
CO2: 26 meq/L (ref 19–32)
CREATININE: 1.3 mg/dL (ref 0.50–1.35)
Chloride: 100 mEq/L (ref 96–112)
GFR calc Af Amer: 82 mL/min — ABNORMAL LOW (ref >90)
GFR calc non Af Amer: 70 mL/min — ABNORMAL LOW (ref >90)
GLUCOSE: 106 mg/dL — AB (ref 70–99)
Potassium: 3.9 mEq/L (ref 3.7–5.3)
Sodium: 137 mEq/L (ref 137–147)

## 2014-01-30 MED ORDER — METOCLOPRAMIDE HCL 10 MG PO TABS: 10.0000 mg | ORAL_TABLET | Freq: Four times a day (QID) | ORAL | Status: DC | PRN

## 2014-01-30 MED ORDER — DIPHENHYDRAMINE HCL 50 MG/ML IJ SOLN
25.0000 mg | Freq: Once | INTRAMUSCULAR | Status: AC
  Administered 2014-01-30: 25 mg via INTRAVENOUS
  Filled 2014-01-30: qty 1

## 2014-01-30 MED ORDER — METOCLOPRAMIDE HCL 5 MG/ML IJ SOLN
10.0000 mg | Freq: Once | INTRAMUSCULAR | Status: AC
  Administered 2014-01-30: 10 mg via INTRAVENOUS
  Filled 2014-01-30: qty 2

## 2014-01-30 MED ORDER — SODIUM CHLORIDE 0.9 % IV BOLUS (SEPSIS)
1000.0000 mL | Freq: Once | INTRAVENOUS | Status: AC
  Administered 2014-01-30: 1000 mL via INTRAVENOUS

## 2014-01-30 MED ORDER — DIPHENHYDRAMINE HCL 50 MG/ML IJ SOLN: 25.0000 mg | Freq: Once | INTRAMUSCULAR | Status: DC

## 2014-01-30 MED ORDER — KETOROLAC TROMETHAMINE 30 MG/ML IJ SOLN
30.0000 mg | Freq: Once | INTRAMUSCULAR | Status: AC
  Administered 2014-01-30: 30 mg via INTRAVENOUS
  Filled 2014-01-30: qty 1

## 2014-01-30 NOTE — Discharge Instructions (Signed)
Stay hydrated.  Take reglan as needed for nausea or headaches.   Follow up with your doctor.   Return to ER if you have vomiting, worse dehydration, passing out.

## 2014-01-30 NOTE — ED Notes (Addendum)
Patient states that he got "real dizzy", stood up and fell. States that he did not hit his head or catch himself, states that he fell up against a wall. Patient states this has happened before and at that time he was dehydrated. [facility]

## 2014-01-30 NOTE — ED Provider Notes (Signed)
CSN: [id number]     Arrival date & time 01/30/14  1506 History  This chart was scribed for Dillon Canalavid H Yao, MD by Danella Maiersaroline Early, ED Scribe. This patient was seen in room MH04/MH04 and the patient's care was started at 5:04 PM.    Chief Complaint  Patient presents with  . Near Syncope   The history is provided by the patient. No language interpreter was used.   HPI Comments: Dillon GuardianLeo T Marceaux Jr. is a 33 y.o. male with a h/o HIV who presents to the Emergency Department complaining of a near syncopal episode tonight. Pt states he suddenly became very dizzy and unbalanced. He stumbled and fell against the wall. He denies completely losing consciousness. Pt reports a prior h/o the same when he was dehydrated.  He also reports headaches, nausea, vomiting, and diarrhea for the last 2 days. His last episode of vomiting was in the waiting room. He denies fevers. He denies sick contacts.  For HIV, he is on prezistra, truvada, and [facility]. He is complaint with all medications. He gets labs drawn every 3 months and his CD4 has been 500 or above.    Past Medical History  Diagnosis Date  . HIV disease   . GERD (gastroesophageal reflux disease)   . Ulcer    Past Surgical History  Procedure Laterality Date  . Wisdom tooth extraction     Family History  Problem Relation Age of Onset  . Cancer Mother     colon  . Diabetes Father   . Hypertension Father   . Diabetes Sister   . Hypertension Sister   . Psoriasis Maternal Grandmother   . Hypertension Maternal Grandfather   . Cancer Maternal Grandfather     lung  . Diabetes Maternal Grandfather   . Diabetes Paternal Grandmother   . Alzheimer's disease Paternal Grandmother   . Cancer Paternal Grandfather     lung   History  Substance Use Topics  . Smoking status: Former Games developermoker  . Smokeless tobacco: Not on file  . Alcohol Use: Yes     Comment: 2 x week    Review of Systems  Constitutional: Negative for fever.   Gastrointestinal: Positive for nausea, vomiting and diarrhea.  Neurological: Positive for dizziness and headaches. Negative for syncope (near).  All other systems reviewed and are negative.      Allergies  Ciprofloxacin; Dapsone; and Primaquine  Home Medications   Current Outpatient Rx  Name  Route  Sig  Dispense  Refill  . emtricitabine-tenofovir (TRUVADA) 200-300 MG per tablet   Oral   Take 1 tablet by mouth daily. [facility]         . azithromycin [facility](ZITHROMAX) 1 G powder   Oral   Take 1 packet (1 g total) by mouth once.   1 each   0   . darunavir (PREZISTA) 400 MG tablet   Oral   Take 800 mg by mouth daily. [facility]         . omeprazole (PRILOSEC) 20 MG capsule   Oral   Take 20 mg by mouth daily.         . STUDY MEDICATION   Oral   Take 1 tablet by mouth daily. Cobicistat 150 mg [facility]          BP 109/82  Pulse 100  Temp(Src) 99 F (37.2 C) (Oral)  Resp 18  Ht 6\' 1"  (1.854 m)  Wt 181 lb (82.101 kg)  BMI 23.89 kg/m2  SpO2 97% Physical Exam  Nursing note and vitals reviewed. Constitutional: He is oriented to person, place, and time. He appears well-developed and well-nourished. No distress.  HENT:  Head: Normocephalic and atraumatic.  Slightly dry mucous membranes  Eyes: EOM are normal.  Neck: Neck supple. No tracheal deviation present.  Cardiovascular: Normal rate, regular rhythm and normal heart sounds.   Pulmonary/Chest: Effort normal and breath sounds normal. No respiratory distress. He has no wheezes.  Abdominal: Soft. There is no tenderness.  Musculoskeletal: Normal range of motion.  Neurological: He is alert and oriented to person, place, and time.  Cranial nerves 2-12 intact. Normal strength.  Skin: Skin is warm and dry.  Psychiatric: He has a normal mood and affect. His behavior is normal.    ED Course  Procedures (including critical care  time) Medications  sodium chloride 0.9 % bolus 1,000 mL (0 mLs Intravenous Stopped 01/30/14 1739)  metoCLOPramide (REGLAN) injection 10 mg (10 mg Intravenous Given 01/30/14 1739)  ketorolac (TORADOL) 30 MG/ML injection 30 mg (30 mg Intravenous Given 01/30/14 1739)  sodium chloride 0.9 % bolus 1,000 mL (1,000 mLs Intravenous New Bag/Given 01/30/14 1740)  diphenhydrAMINE (BENADRYL) injection 25 mg (25 mg Intravenous Given 01/30/14 1739)    DIAGNOSTIC STUDIES: Oxygen Saturation is 97% on RA, normal by my interpretation.    COORDINATION OF CARE: 5:21 PM- Discussed treatment plan with pt. Pt agrees to plan.    Labs Review Labs Reviewed  BASIC METABOLIC PANEL - Abnormal; Notable for the following:    Glucose, Bld 106 (*)    GFR calc non Af Amer 70 (*)    GFR calc Af Amer 82 (*)    All other components within normal limits  CBC WITH DIFFERENTIAL   Imaging Review No results found.   EKG Interpretation   Date/Time:  Sunday January 30 2014 16:00:22 EST Ventricular Rate:  77 PR Interval:  164 QRS Duration: 84 QT Interval:  362 QTC Calculation: 409 R Axis:   60 Text Interpretation:  Normal sinus rhythm Normal ECG No significant change  since last tracing Confirmed by YAO  MD, DAVID ([id number]) on 01/30/2014 4:03:48  PM      MDM   Final diagnoses:  None  Dillon Marian. is a 33 y.o. male here with near syncope in the setting of gastro. EKG unremarkable. He was orthostatic, given IVF and felt better. Will d/c home on reglan.    I personally performed the services described in this documentation, which was scribed in my presence. The recorded information has been reviewed and is accurate.   Dillon Canal, MD 01/30/14 [phone]

## 2014-02-01 ENCOUNTER — Ambulatory Visit (INDEPENDENT_AMBULATORY_CARE_PROVIDER_SITE_OTHER): Payer: BC Managed Care – PPO | Admitting: Emergency Medicine

## 2014-02-01 VITALS — BP 130/62 | HR 83 | Temp 99.2°F | Resp 18 | Wt 176.0 lb

## 2014-02-01 DIAGNOSIS — B349 Viral infection, unspecified: Secondary | ICD-10-CM

## 2014-02-01 DIAGNOSIS — B9789 Other viral agents as the cause of diseases classified elsewhere: Secondary | ICD-10-CM

## 2014-02-01 DIAGNOSIS — R112 Nausea with vomiting, unspecified: Secondary | ICD-10-CM

## 2014-02-01 MED ORDER — ONDANSETRON 8 MG PO TBDP: 8.0000 mg | ORAL_TABLET | Freq: Three times a day (TID) | ORAL | Status: DC | PRN

## 2014-02-01 NOTE — Progress Notes (Addendum)
   Subjective:    Patient ID: Dillon GuardianLeo T Sky Jr., male    DOB: 02/09/1981, 33 y.o.   MRN: [id number]  HPI 33 yo male with complaint of nausea, vomiting, fever and diarrhea onset 3 days ago.  Seen in ED and treated with Reglan.  No relief.  Subjective fever today, took motrin for relief.  Tolerated small amount of liquids today, but continues to have nausea.  Only crackers for solid food.  No headache or neck stiffness.  PPMH:  HIV (undetectable viral load)  SH:  Former smoker, occasional alcohol   Review of Systems As per HPI, otherwise all systems negative.     Objective:   Physical Exam Blood pressure 130/62, pulse 83, temperature 99.2 F (37.3 C), temperature source Oral, resp. rate 18, weight 176 lb (79.833 kg), SpO2 97.00%. Body mass index is 23.23 kg/(m^2). Well-developed, well nourished male who is awake, alert and oriented, in NAD. HEENT: [place]/AT, PERRL, EOMI.  Sclera and conjunctiva are clear.  Moist mucus membranes. Neck: supple, non-tender, no lymphadenopathy, thyromegaly. Heart: RRR, no murmur Lungs: normal effort, CTA Abdomen: normo-active bowel sounds, supple, non-tender, no mass or organomegaly. Extremities: no cyanosis, clubbing or edema. Skin: warm and dry without rash. Psychologic: good mood and appropriate affect, normal speech and behavior.     Assessment & Plan:  Viral illness with gastrointestinal symptoms.  I advised him to drink lots of fluids, stay home from work for two days.  Also discontinue the Reglan as this may exacerbate diarrhea and start Zofran ODT as needed for nausea.    Follow up as needed in 2-5 days.  If symptoms worsen return immediately.  I have reviewed and agree with documentation. Robert P. Merla Richesoolittle, M.D.

## 2014-02-01 NOTE — Patient Instructions (Signed)
Fever, Adult A fever is a higher than normal body temperature. In an adult, an oral temperature around 98.6 F (37 C) is considered normal. A temperature of 100.4 F (38 C) or higher is generally considered a fever. Mild or moderate fevers generally have no long-term effects and often do not require treatment. Extreme fever (greater than or equal to 106 F or 41.1 C) can cause seizures. The sweating that may occur with repeated or prolonged fever may cause dehydration. Elderly people can develop confusion during a fever. A measured temperature can vary with:  Age.  Time of day.  Method of measurement (mouth, underarm, rectal, or ear). The fever is confirmed by taking a temperature with a thermometer. Temperatures can be taken different ways. Some methods are accurate and some are not.  An oral temperature is used most commonly. Electronic thermometers are fast and accurate.  An ear temperature will only be accurate if the thermometer is positioned as recommended by the manufacturer.  A rectal temperature is accurate and done for those adults who have a condition where an oral temperature cannot be taken.  An underarm (axillary) temperature is not accurate and not recommended. Fever is a symptom, not a disease.  CAUSES   Infections commonly cause fever.  Some noninfectious causes for fever include:  Some arthritis conditions.  Some thyroid or adrenal gland conditions.  Some immune system conditions.  Some types of cancer.  A medicine reaction.  High doses of certain street drugs such as methamphetamine.  Dehydration.  Exposure to high outside or room temperatures.  Occasionally, the source of a fever cannot be determined. This is sometimes called a "fever of unknown origin" (FUO).  Some situations may lead to a temporary rise in body temperature that may go away on its own. Examples are:  Childbirth.  Surgery.  Intense exercise. HOME CARE INSTRUCTIONS   Take  appropriate medicines for fever. Follow dosing instructions carefully. If you use acetaminophen to reduce the fever, be careful to avoid taking other medicines that also contain acetaminophen. Do not take aspirin for a fever if you are younger than age 19. There is an association with Reye's syndrome. Reye's syndrome is a rare but potentially deadly disease.  If an infection is present and antibiotics have been prescribed, take them as directed. Finish them even if you start to feel better.  Rest as needed.  Maintain an adequate fluid intake. To prevent dehydration during an illness with prolonged or recurrent fever, you may need to drink extra fluid.Drink enough fluids to keep your urine clear or pale yellow.  Sponging or bathing with room temperature water may help reduce body temperature. Do not use ice water or alcohol sponge baths.  Dress comfortably, but do not over-bundle. SEEK MEDICAL CARE IF:   You are unable to keep fluids down.  You develop vomiting or diarrhea.  You are not feeling at least partly better after 3 days.  You develop new symptoms or problems. SEEK IMMEDIATE MEDICAL CARE IF:   You have shortness of breath or trouble breathing.  You develop excessive weakness.  You are dizzy or you faint.  You are extremely thirsty or you are making little or no urine.  You develop new pain that was not there before (such as in the head, neck, chest, back, or abdomen).  You have persistant vomiting and diarrhea for more than 1 to 2 days.  You develop a stiff neck or your eyes become sensitive to light.  You develop a   skin rash.  You have a fever or persistent symptoms for more than 2 to 3 days.  You have a fever and your symptoms suddenly get worse. MAKE SURE YOU:   Understand these instructions.  Will watch your condition.  Will get help right away if you are not doing well or get worse. Document Released: 05/14/2001 Document Revised: 02/10/2012 Document  Reviewed: 09/19/2011 [facility] Patient Information 2014 [place]. Nausea and Vomiting Nausea is a sick feeling that often comes before throwing up (vomiting). Vomiting is a reflex where stomach contents come out of your mouth. Vomiting can cause severe loss of body fluids (dehydration). Children and elderly adults can become dehydrated quickly, especially if they also have diarrhea. Nausea and vomiting are symptoms of a condition or disease. It is important to find the cause of your symptoms. CAUSES   Direct irritation of the stomach lining. This irritation can result from increased acid production (gastroesophageal reflux disease), infection, food poisoning, taking certain medicines (such as nonsteroidal anti-inflammatory drugs), alcohol use, or tobacco use.  Signals from the brain.These signals could be caused by a headache, heat exposure, an inner ear disturbance, increased pressure in the brain from injury, infection, a tumor, or a concussion, pain, emotional stimulus, or metabolic problems.  An obstruction in the gastrointestinal tract (bowel obstruction).  Illnesses such as diabetes, hepatitis, gallbladder problems, appendicitis, kidney problems, cancer, sepsis, atypical symptoms of a heart attack, or eating disorders.  Medical treatments such as chemotherapy and radiation.  Receiving medicine that makes you sleep (general anesthetic) during surgery. DIAGNOSIS Your caregiver may ask for tests to be done if the problems do not improve after a few days. Tests may also be done if symptoms are severe or if the reason for the nausea and vomiting is not clear. Tests may include:  Urine tests.  Blood tests.  Stool tests.  Cultures (to look for evidence of infection).  X-rays or other imaging studies. Test results can help your caregiver make decisions about treatment or the need for additional tests. TREATMENT You need to stay well hydrated. Drink frequently but in small  amounts.You may wish to drink water, sports drinks, clear broth, or eat frozen ice pops or gelatin dessert to help stay hydrated.When you eat, eating slowly may help prevent nausea.There are also some antinausea medicines that may help prevent nausea. HOME CARE INSTRUCTIONS   Take all medicine as directed by your caregiver.  If you do not have an appetite, do not force yourself to eat. However, you must continue to drink fluids.  If you have an appetite, eat a normal diet unless your caregiver tells you differently.  Eat a variety of complex carbohydrates (rice, wheat, potatoes, bread), lean meats, yogurt, fruits, and vegetables.  Avoid high-fat foods because they are more difficult to digest.  Drink enough water and fluids to keep your urine clear or pale yellow.  If you are dehydrated, ask your caregiver for specific rehydration instructions. Signs of dehydration may include:  Severe thirst.  Dry lips and mouth.  Dizziness.  Dark urine.  Decreasing urine frequency and amount.  Confusion.  Rapid breathing or pulse. SEEK IMMEDIATE MEDICAL CARE IF:   You have blood or brown flecks (like coffee grounds) in your vomit.  You have black or bloody stools.  You have a severe headache or stiff neck.  You are confused.  You have severe abdominal pain.  You have chest pain or trouble breathing.  You do not urinate at least once every 8 hours.  You develop cold or clammy skin.  You continue to vomit for longer than 24 to 48 hours.  You have a fever. MAKE SURE YOU:   Understand these instructions.  Will watch your condition.  Will get help right away if you are not doing well or get worse. Document Released: 11/18/2005 Document Revised: 02/10/2012 Document Reviewed: 04/17/2011 [facility] Patient Information 2014 [place], [place].

## 2014-03-26 ENCOUNTER — Ambulatory Visit (INDEPENDENT_AMBULATORY_CARE_PROVIDER_SITE_OTHER): Payer: BC Managed Care – PPO | Admitting: Family Medicine

## 2014-03-26 VITALS — BP 120/80 | HR 65 | Temp 98.7°F | Resp 16 | Ht 71.5 in | Wt 173.0 lb

## 2014-03-26 DIAGNOSIS — F32A Depression, unspecified: Secondary | ICD-10-CM

## 2014-03-26 DIAGNOSIS — F3289 Other specified depressive episodes: Secondary | ICD-10-CM

## 2014-03-26 DIAGNOSIS — K122 Cellulitis and abscess of mouth: Secondary | ICD-10-CM

## 2014-03-26 DIAGNOSIS — J029 Acute pharyngitis, unspecified: Secondary | ICD-10-CM

## 2014-03-26 DIAGNOSIS — F329 Major depressive disorder, single episode, unspecified: Secondary | ICD-10-CM

## 2014-03-26 DIAGNOSIS — Z8619 Personal history of other infectious and parasitic diseases: Secondary | ICD-10-CM

## 2014-03-26 DIAGNOSIS — Z202 Contact with and (suspected) exposure to infections with a predominantly sexual mode of transmission: Secondary | ICD-10-CM

## 2014-03-26 LAB — POCT RAPID STREP A (OFFICE): Rapid Strep A Screen: NEGATIVE

## 2014-03-26 MED ORDER — PAROXETINE HCL 10 MG PO TABS: 10.0000 mg | ORAL_TABLET | Freq: Every day | ORAL | Status: DC

## 2014-03-26 MED ORDER — CEFTRIAXONE SODIUM 1 G IJ SOLR
250.0000 mg | Freq: Once | INTRAMUSCULAR | Status: AC
  Administered 2014-03-26: 250 mg via INTRAMUSCULAR

## 2014-03-26 NOTE — Patient Instructions (Signed)
Call counselor for appointment: Dillon Wilson [phone]  Walking for exercise most days of week.   Recheck in next 2-3 weeks, sooner if worse to discuss depression. Cal your infectious disease doctor to make sure there are no problems with the new medicine and your other medicines and keep follow up with them in next month.    recheck in next 4-5 days if sore throat no improved.   Return to the clinic or go to the nearest emergency room if any of your symptoms worsen or new symptoms occur.

## 2014-03-26 NOTE — Progress Notes (Signed)
Subjective:    Patient ID: Dillon GuardianLeo T Scavone Jr., male    DOB: 03/23/1981, 33 y.o.   MRN: [id number]  HPI Chief Complaint  Patient presents with   Sore Throat    x 3 day   Shoulder Pain    left shoulder x 2 day   Weight Loss    without trying    This chart was scribed for Meredith StaggersJeffrey Greene, MD by Andrew Auaven Small, ED Scribe. This patient was seen in room 11 and the patient's care was started at 10:49 AM.  HPI Comments: Dillon GuardianLeo T Jordahl Jr. is a 33 y.o. male who presents to the [facility] complaining of sore throat onset 2-3 days. Pt reports sore throat was worse 1 day ago but reports that he feels mild improvement today. Pt reports muscle soreness under left arm and left chest. Pt denies injury to to left arm. Pt denies feeling lymph node, lumps or bumps to left armpit. Pt reports associated cough and rhinorrhea. Pt denies taking any medication for symptoms. Pt reports taking Claritin.  Pt denies fever and emesis.  Pt reports sick contact . 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  Pt also complains of weight loss. During March 3rd 2015 visit pt weighed 176 and during March 13th 2014 visit pt weighed 182. Pt reports that he is stressed onset 4-5 months. Pt denies having an appetite. Pt reports that when he does eat he is unable to keep food in his system. Pt reports forcing himself to consume food. Pt reports h/o depression. Pt denies taking medication at this time for depression. Pt reports that he has taken Paxil in the past but he reports that he did not like the medication. He states that the medication made him feel like a zombie. Pt also reports taking Celexa  in the past which made him zone out often. Pt reports that the only medication he is taking at this time is xanax prescribed by ID doctor. Pt reports that he does not get out and enjoy life. Pt denies seeing a Veterinary surgeoncounselor. Pt reports that he shut down and felt numb about 1  months ago. Pt reports that his partner has been unfaithful. He report that he know longer sees this person but is still hurt. Pt denies seizures. pt denies suicidal ideas. Pt reports that he has been on edge and has thoughts of harming someone. Pt denies thought of homicide. Pt denies having a gun or firearm.   Pt was treated in February for possible exposure to gonorrhea. Was given 250mg  shot of rocephin and 1g of azithromycin powder but unfortunately vomited immediately after medication was given. Pt reports possible symptoms and has had new sexual partner in the past few months and has been involved in receptive oral intercourse with 2 partners without protection and is concerned of exposure to STI. He denies any other intercourse other than oral intercourse listed as above. Pt denies difficulty urinating, penile pain, rash, and testicular pain. Pt has h/o [facility] disease [facility]. Medicines as listed as well as [facility] medication [facility].   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 Authorizing Provider  darunavir (PREZISTA) 400 MG tablet Take 800 mg by mouth daily. [facility]     Historical Provider, MD  emtricitabine-tenofovir (TRUVADA) 200-300 MG per tablet Take 1 tablet by mouth daily. [facility]    Historical Provider, MD  metoCLOPramide (REGLAN) 10 MG tablet Take 1 tablet (10 mg total) by mouth every 6 (six) hours as needed for nausea (nausea/headache). 01/30/14   Richardean Canalavid H Yao, MD  omeprazole (PRILOSEC) 20 MG capsule Take 20 mg by mouth daily.    Historical Provider, MD  ondansetron (ZOFRAN-ODT) 8 MG disintegrating tablet Take 1 tablet (8 mg total) by mouth every 8 (eight) hours as needed for nausea. 02/01/14   Tarry Kosodd M McGrath, MD  STUDY MEDICATION Take 1 tablet by mouth daily. Cobicistat 150 mg through [facility]    Historical Provider, MD   History   Social History   Marital Status: Single    Spouse Name: N/A    Number of Children: N/A   Years of Education: N/A   Occupational History   Not on file.   Social History Main Topics   Smoking status: Former Smoker   Smokeless tobacco: Not on file   Alcohol Use: Yes     Comment: 2 x week   Drug Use: Yes     Comment: marijuana   Sexual Activity: Yes   Other Topics Concern   Not on file   Social History Narrative   No narrative on file    Review of Systems  Constitutional: Negative for fever, chills and unexpected weight change.  HENT: Positive for rhinorrhea and sore throat.   Respiratory: Positive for cough.   Gastrointestinal: Positive for nausea. Negative for vomiting.  Genitourinary: Negative for discharge, difficulty urinating, penile pain and testicular pain.  Musculoskeletal: Positive for myalgias.  Skin: Negative for rash.  Psychiatric/Behavioral: Positive for dysphoric mood. Negative for suicidal ideas.       Objective:   Physical Exam  Vitals reviewed. Constitutional: He is oriented to person, place, and time. He appears well-developed and well-nourished.  HENT:  Head: Normocephalic and atraumatic.  Right Ear: Tympanic membrane,  external ear and ear canal normal.  Left Ear: Tympanic membrane, external ear and ear canal normal.  Nose: No rhinorrhea.  Mouth/Throat: Oropharynx is clear and moist and mucous membranes are normal. No oropharyngeal exudate or posterior oropharyngeal erythema.  Uvula Elongated  Small white adherent area on right tonsil  Eyes: Conjunctivae are normal. Pupils are equal, round, and reactive to light.  Neck: Neck supple.  Cardiovascular: Normal rate, regular rhythm, normal heart sounds and intact distal pulses.   No murmur heard. Pulmonary/Chest: Effort normal and breath sounds normal. He has no wheezes. He has no rhonchi. He has no rales.  Abdominal: Soft. There is no hepatosplenomegaly. There is tenderness ( minimal).  Musculoskeletal:       Left shoulder: He exhibits normal range of motion, no tenderness  and no bony tenderness.  Left shoulder- Full ROM. No focal or bony tenderness.  Full rotator cuff  Lymphadenopathy:    He has no cervical adenopathy.  Neurological: He is alert and oriented to person, place, and time.  Skin: Skin is warm and dry. No rash noted.  Psychiatric: He has a normal mood and affect. His behavior is normal.   Results for orders placed in visit on 03/26/14  POCT RAPID STREP A (OFFICE)      Result Value Ref Range   Rapid Strep A Screen Negative  Negative      Assessment & Plan:   Oda Lansdowne. is a 33 y.o. male Sore throat, Exposure to STD, Uvulitis  - Plan: Gonococcus culture, cefTRIAXone (ROCEPHIN) injection 250 mg  - throat cx and GC culture as hist of gonococcal pharyngitis and unprotected oral intercourse - Rocephin given again for this reason, but cx's pending. Safer sex practices discussed.   Depression - Plan: PARoxetine (PAXIL) 10 MG tablet - denies SI. Anger component with depression. Refer to counseling/CBT, and restart paxil as wt loss component with decreased appetite.  SED and can try other med if this is not tolerated.  Follow up in next  2-3 weeks for recheck.   Shoulder pain - ? msk source, no concerning findings on exam - if not improving with tx above in next week - recheck.   Meds ordered this encounter  Medications   PARoxetine (PAXIL) 10 MG tablet    Sig: Take 1 tablet (10 mg total) by mouth daily.    Dispense:  30 tablet    Refill:  1   cefTRIAXone (ROCEPHIN) injection 250 mg    Sig:     Order Specific Question:  Antibiotic Indication:    Answer:  Other Indication (list below)    Order Specific Question:  Other Indication:    Answer:  pharyngitis, uvular swelling   Patient Instructions  Call counselor for appointment: Dayton Scrape [phone]  Walking for exercise most days of week.   Recheck in next 2-3 weeks, sooner if worse to discuss depression. Cal your infectious disease doctor to make sure there are no problems with the new medicine and your other medicines and keep follow up with them in next month.    recheck in next 4-5 days if sore throat no improved.   Return to the clinic or go to the nearest emergency room if any of your symptoms worsen or new symptoms occur.        I personally performed the services described in this documentation, which was scribed in my presence. The recorded information has been reviewed and considered, and addended by me as needed.

## 2014-03-28 LAB — CULTURE, GROUP A STREP

## 2014-03-28 LAB — GONOCOCCUS CULTURE

## 2014-03-31 ENCOUNTER — Telehealth: Payer: Self-pay | Admitting: *Deleted

## 2014-03-31 ENCOUNTER — Telehealth: Payer: Self-pay

## 2014-03-31 NOTE — Telephone Encounter (Signed)
Pt is returning Dillon Wilson's phone call. I told him that it looks like he needs to come back in to recheck. He wants to know why we didn't write him for an antibiotic to start taking incase his strep culture came back positive. Pt states that he does not have the co-pay to return. His # is [phone]

## 2014-03-31 NOTE — Telephone Encounter (Signed)
LMVM to CB. 

## 2014-03-31 NOTE — Telephone Encounter (Signed)
See other phone message Left message on machine to call back

## 2014-03-31 NOTE — Telephone Encounter (Signed)
Spoke with pt. He had just wanted to clarify that the injection did cover the strep. I advised him that it did. He is overall feeling better

## 2014-03-31 NOTE — Telephone Encounter (Signed)
He was given an injection of antibiotics at his office visit, which should cover strep infection.  Per Dr. Paralee CancelGreene's note - "recheck in next 4-5 days if sore throat not improved. "  Pt needs to RTC

## 2014-03-31 NOTE — Telephone Encounter (Signed)
Patient was seen in our 102 office on Saturday. Patient states he received a call stating that his strep throat culture came back as positive. Patient states he received an injection during his visit but he is still not feeling any better. Please return call and advise. Patient would like an antibiotic sent to his pharmacy because he does not wish to come back in to be seen at this time.

## 2014-04-01 NOTE — Telephone Encounter (Signed)
LMVM to CB. 

## 2014-04-04 NOTE — Telephone Encounter (Signed)
Pt notified. He just wanted to make sure that injection covered strep

## 2014-05-31 ENCOUNTER — Ambulatory Visit (INDEPENDENT_AMBULATORY_CARE_PROVIDER_SITE_OTHER): Payer: BC Managed Care – PPO | Admitting: Family Medicine

## 2014-05-31 VITALS — BP 112/80 | HR 68 | Temp 98.1°F | Resp 16 | Ht 72.0 in | Wt 170.2 lb

## 2014-05-31 DIAGNOSIS — J029 Acute pharyngitis, unspecified: Secondary | ICD-10-CM

## 2014-05-31 DIAGNOSIS — B20 Human immunodeficiency virus [HIV] disease: Secondary | ICD-10-CM

## 2014-05-31 DIAGNOSIS — Z21 Asymptomatic human immunodeficiency virus [HIV] infection status: Secondary | ICD-10-CM

## 2014-05-31 LAB — CBC WITH DIFFERENTIAL/PLATELET
BASOS ABS: 0.1 10*3/uL (ref 0.0–0.1)
BASOS PCT: 1 % (ref 0–1)
Eosinophils Absolute: 0.1 10*3/uL (ref 0.0–0.7)
Eosinophils Relative: 1 % (ref 0–5)
HCT: 39.8 % (ref 39.0–52.0)
Hemoglobin: 13.6 g/dL (ref 13.0–17.0)
LYMPHS PCT: 26 % (ref 12–46)
Lymphs Abs: 2.2 10*3/uL (ref 0.7–4.0)
MCH: 28.2 pg (ref 26.0–34.0)
MCHC: 34.2 g/dL (ref 30.0–36.0)
MCV: 82.6 fL (ref 78.0–100.0)
Monocytes Absolute: 0.4 10*3/uL (ref 0.1–1.0)
Monocytes Relative: 5 % (ref 3–12)
NEUTROS ABS: 5.7 10*3/uL (ref 1.7–7.7)
Neutrophils Relative %: 67 % (ref 43–77)
Platelets: 191 10*3/uL (ref 150–400)
RBC: 4.82 MIL/uL (ref 4.22–5.81)
RDW: 13.8 % (ref 11.5–15.5)
WBC: 8.5 10*3/uL (ref 4.0–10.5)

## 2014-05-31 LAB — POCT RAPID STREP A (OFFICE): Rapid Strep A Screen: NEGATIVE

## 2014-05-31 MED ORDER — BENZONATATE 100 MG PO CAPS: 100.0000 mg | ORAL_CAPSULE | Freq: Three times a day (TID) | ORAL | Status: DC | PRN

## 2014-05-31 MED ORDER — IPRATROPIUM BROMIDE 0.03 % NA SOLN: 2.0000 | Freq: Two times a day (BID) | NASAL | Status: DC

## 2014-05-31 MED ORDER — AMOXICILLIN 500 MG PO CAPS: 1000.0000 mg | ORAL_CAPSULE | Freq: Two times a day (BID) | ORAL | Status: DC

## 2014-05-31 NOTE — Patient Instructions (Signed)

## 2014-05-31 NOTE — Progress Notes (Addendum)
Subjective:    Patient ID: Dillon Dupriest., male    DOB: 10-03-1981, 34 y.o.   MRN: [id number] This chart was scribed for Reginia Forts, MD by Vernell Barrier, Medical Scribe. The patient was seen in room 13. This patient's care was started at 8:13 PM.  05/31/2014  Chills, Sore Throat and Nasal Congestion  HPI HPI Comments: Dillon Brining. is a 33 y.o. male who presents to the [facility] complaining of chills, HA, scratchy throat, post nasal drip, and thick yellow rhinorrhea, onset 2-3 days ago. 2 episodes of emesis with nausea. Reports some cough but only to clear out phlegm. Taking Aleve, Claritin, and Ibuprofen with minimal relief. Pt is HIV positive and compliant with all medications. Viral load has been undetected for 3 years. Undetected 04/19/14. CD4 count 611 04/19/14. No new sexual partners. No concern for STD. 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 Denies diaphoresis, fever, SOB, diarrhea, abdominal pain, rash, or ear pain.  Aleve-D.  Claritin.    PCP: Dr. Joseph Art   Review of Systems  Constitutional: Positive for chills. Negative for fever and diaphoresis.  HENT: Positive for postnasal drip, rhinorrhea and sore throat. Negative for ear pain.   Respiratory: Positive for cough. Negative for shortness of breath.   Gastrointestinal: Positive for nausea. Negative for abdominal pain and constipation.  Skin: Negative for rash.   Past Medical History  Diagnosis Date  . HIV disease   . GERD (gastroesophageal reflux disease)   . Ulcer    Past Surgical History  Procedure Laterality Date  . Wisdom tooth extraction      Allergies  Allergen Reactions  . Ciprofloxacin Diarrhea and Nausea Only  . Dapsone Other (See Comments)    G6PD deficiency  . Primaquine     Other reaction(s): Other (See Comments) G6PD deficiency   Current Outpatient Prescriptions  Medication Sig Dispense Refill  . darunavir (PREZISTA) 400 MG tablet Take 800 mg by mouth daily.  Through a study at [facility]      . emtricitabine-tenofovir (TRUVADA) 200-300 MG per tablet Take 1 tablet by mouth daily. Through a study at [facility]      . omeprazole (PRILOSEC) 20 MG capsule Take 20 mg by mouth daily.      . STUDY MEDICATION Take 1 tablet by mouth daily. Cobicistat 150 mg through [facility]      . amoxicillin (AMOXIL) 500 MG capsule Take 2 capsules (1,000 mg total) by mouth 2 (two) times daily.  40 capsule  0  . azithromycin (ZITHROMAX) 250 MG tablet Take 4 pills one dose  4 each  0  . benzonatate (TESSALON) 100 MG capsule Take 1-2 capsules (100-200 mg total) by mouth 3 (three) times daily as needed for cough.  40 capsule  0  . ipratropium (ATROVENT) 0.03 % nasal spray Place 2 sprays into the nose 2 (two) times daily.  30 mL  0   No current facility-administered medications for this visit.   History   Social History  . Marital Status: Single    Spouse Name: N/A    Number of Children: N/A  . Years of Education: N/A   Occupational History  . Not on file.   Social History Main Topics  . Smoking status: Former Research scientist (life sciences)  . Smokeless tobacco: Never Used  . Alcohol Use: Yes     Comment: 2 x week  . Drug Use: Yes     Comment: marijuana  .  Sexual Activity: Yes   Other Topics Concern  . Not on file   Social History Narrative  . No narrative on file    Objective:  Triage vitals: BP 112/80  Pulse 68  Temp(Src) 98.1 F (36.7 C) (Oral)  Resp 16  Ht 6' (1.829 m)  Wt 170 lb 4 oz (77.225 kg)  BMI 23.09 kg/m2  SpO2 98%  Physical Exam  Vitals reviewed. Constitutional: He is oriented to person, place, and time. He appears well-developed and well-nourished. No distress.  HENT:  Head: Normocephalic and atraumatic.  Right Ear: External ear normal.  Left Ear: External ear normal. Tympanic membrane is erythematous (slightly).  Mouth/Throat: Uvula is midline. Posterior oropharyngeal erythema present. No oropharyngeal exudate.    Eyes: Conjunctivae and EOM are normal. Pupils are equal, round, and reactive to light.  Neck: Neck supple. No tracheal deviation present.  Cardiovascular: Normal rate, regular rhythm and normal heart sounds.  Exam reveals no gallop and no friction rub.   No murmur heard. Pulmonary/Chest: Effort normal and breath sounds normal. No respiratory distress. He has no wheezes. He has no rales.  Abdominal: Soft. Bowel sounds are normal. He exhibits no distension and no mass. There is no tenderness. There is no rebound and no guarding.  Musculoskeletal: Normal range of motion.  Lymphadenopathy:    He has no cervical adenopathy.  Neurological: He is alert and oriented to person, place, and time.  Skin: Skin is warm and dry.  Psychiatric: He has a normal mood and affect. His behavior is normal.   Results for orders placed in visit on 05/31/14  CULTURE, GROUP A STREP      Result Value Ref Range   Organism ID, Bacteria Normal Upper Respiratory Flora     Organism ID, Bacteria No Beta Hemolytic Streptococci Isolated    GONOCOCCUS CULTURE      Result Value Ref Range   Organism ID, Bacteria NO GROWTH 2 DAYS    CHLAMYDIA CULTURE      Result Value Ref Range   Organism ID, Bacteria No Chlamydia identified in cell culture    CBC WITH DIFFERENTIAL      Result Value Ref Range   WBC 8.5  4.0 - 10.5 K/uL   RBC 4.82  4.22 - 5.81 MIL/uL   Hemoglobin 13.6  13.0 - 17.0 g/dL   HCT 39.8  39.0 - 52.0 %   MCV 82.6  78.0 - 100.0 fL   MCH 28.2  26.0 - 34.0 pg   MCHC 34.2  30.0 - 36.0 g/dL   RDW 13.8  11.5 - 15.5 %   Platelets 191  150 - 400 K/uL   Neutrophils Relative % 67  43 - 77 %   Neutro Abs 5.7  1.7 - 7.7 K/uL   Lymphocytes Relative 26  12 - 46 %   Lymphs Abs 2.2  0.7 - 4.0 K/uL   Monocytes Relative 5  3 - 12 %   Monocytes Absolute 0.4  0.1 - 1.0 K/uL   Eosinophils Relative 1  0 - 5 %   Eosinophils Absolute 0.1  0.0 - 0.7 K/uL   Basophils Relative 1  0 - 1 %   Basophils Absolute 0.1  0.0 - 0.1 K/uL    Smear Review Criteria for review not met    POCT RAPID STREP A (OFFICE)      Result Value Ref Range   Rapid Strep A Screen Negative  Negative       Assessment & Plan:  Sore throat - Plan: POCT rapid strep A, CBC with Differential, Culture, Group A Strep, Gonococcus culture, Chlamydia culture, amoxicillin (AMOXIL) 500 MG capsule, ipratropium (ATROVENT) 0.03 % nasal spray, benzonatate (TESSALON) 100 MG capsule  HIV (human immunodeficiency virus infection)  1. Acute pharyngitis:  New. Send throat culture, gonorrhea cx, chlamydia cx.  Treat empirically with Amoxicillin, Tessalon Perles, Atrovent nasal spray.  RTC for inability to swallow. 2. HIV: stable; followed closely by ID.  Compliance with medications with undetectable viral load.    Meds ordered this encounter  Medications  . amoxicillin (AMOXIL) 500 MG capsule    Sig: Take 2 capsules (1,000 mg total) by mouth 2 (two) times daily.    Dispense:  40 capsule    Refill:  0  . ipratropium (ATROVENT) 0.03 % nasal spray    Sig: Place 2 sprays into the nose 2 (two) times daily.    Dispense:  30 mL    Refill:  0  . benzonatate (TESSALON) 100 MG capsule    Sig: Take 1-2 capsules (100-200 mg total) by mouth 3 (three) times daily as needed for cough.    Dispense:  40 capsule    Refill:  0    Return if symptoms worsen or fail to improve.  I personally performed the services described in this documentation, which was scribed in my presence.  The recorded information has been reviewed and is accurate.  Reginia Forts, M.D.  [facility] [address] [place], [place]  [id number] [phone] phone [phone] fax

## 2014-06-01 ENCOUNTER — Ambulatory Visit (INDEPENDENT_AMBULATORY_CARE_PROVIDER_SITE_OTHER): Payer: BC Managed Care – PPO | Admitting: Emergency Medicine

## 2014-06-01 VITALS — BP 118/78 | HR 78 | Temp 98.3°F | Resp 16 | Ht <= 58 in | Wt 170.0 lb

## 2014-06-01 DIAGNOSIS — Z113 Encounter for screening for infections with a predominantly sexual mode of transmission: Secondary | ICD-10-CM

## 2014-06-01 DIAGNOSIS — Z202 Contact with and (suspected) exposure to infections with a predominantly sexual mode of transmission: Secondary | ICD-10-CM

## 2014-06-01 MED ORDER — AZITHROMYCIN 250 MG PO TABS: ORAL_TABLET | ORAL | Status: DC

## 2014-06-01 MED ORDER — CEFTRIAXONE SODIUM 1 G IJ SOLR
250.0000 mg | Freq: Once | INTRAMUSCULAR | Status: AC
  Administered 2014-06-01: 250 mg via INTRAMUSCULAR

## 2014-06-01 NOTE — Progress Notes (Signed)
   Subjective:    Patient ID: Dillon GuardianLeo T Belasco Jr., male    DOB: 11/25/1981, 33 y.o.   MRN: [id number]  HPI Pt is here today to be evaluated for STD exposure. Pt was seen here last night for sinus issues, and sore throat. He was prescribed amoxicillin last night. He had a gonorrhea swab of his throat last night. Pt was told last night after his visit by his partner that he tested positive for gonorrhea, but has been treated. His partner stated he was treated with an injection and a pill. Pt states he has noticed a penile discharge. He states he does usually practices safe sex. His regular HIV doctor [employer].  Pt has h/o HIV.    Review of Systems     Objective:   Physical Exam patient is alert and cooperative. There is mild redness of the posterior pharynx. Neck is supple. Chest clear genital exam shows no obvious discharge from the meatus.        Assessment & Plan:  Patient given 250 Rocephin he is to finish out his amoxicillin for sinus infection and when he completes that he will take Zithromax 1 g one dose. URiprobe was done today along with syphilis and hep C.

## 2014-06-02 LAB — GC/CHLAMYDIA PROBE AMP
CT PROBE, AMP APTIMA: NEGATIVE
GC Probe RNA: NEGATIVE

## 2014-06-02 LAB — RPR

## 2014-06-02 LAB — HEPATITIS C ANTIBODY: HCV Ab: NEGATIVE

## 2014-06-03 LAB — CULTURE, GROUP A STREP: Organism ID, Bacteria: NORMAL

## 2014-06-03 LAB — GONOCOCCUS CULTURE: Organism ID, Bacteria: NO GROWTH

## 2014-06-06 LAB — CHLAMYDIA CULTURE

## 2014-06-22 ENCOUNTER — Ambulatory Visit (INDEPENDENT_AMBULATORY_CARE_PROVIDER_SITE_OTHER): Payer: BC Managed Care – PPO | Admitting: Family Medicine

## 2014-06-22 VITALS — BP 110/70 | HR 67 | Temp 98.4°F | Ht 72.0 in | Wt 174.0 lb

## 2014-06-22 DIAGNOSIS — H6592 Unspecified nonsuppurative otitis media, left ear: Secondary | ICD-10-CM

## 2014-06-22 DIAGNOSIS — Z202 Contact with and (suspected) exposure to infections with a predominantly sexual mode of transmission: Secondary | ICD-10-CM

## 2014-06-22 DIAGNOSIS — H6590 Unspecified nonsuppurative otitis media, unspecified ear: Secondary | ICD-10-CM

## 2014-06-22 DIAGNOSIS — J029 Acute pharyngitis, unspecified: Secondary | ICD-10-CM

## 2014-06-22 LAB — POCT RAPID STREP A (OFFICE): RAPID STREP A SCREEN: NEGATIVE

## 2014-06-22 MED ORDER — AZITHROMYCIN 250 MG PO TABS: ORAL_TABLET | ORAL | Status: DC

## 2014-06-22 NOTE — Patient Instructions (Signed)
Start Z pak for your left ear infection. We will check a viral culture for herpesvirus and chlamydia and other throat culture for gonorrhea.  Salt water gargles and lozenges ok for now.  Do not place Q tips into ear.  Keep follow up with infectious disease as planned, but return to the clinic or go to the nearest emergency room if any of your symptoms worsen or new symptoms occur.   Otitis Media Otitis media is redness, soreness, and inflammation of the middle ear. Otitis media may be caused by allergies or, most commonly, by infection. Often it occurs as a complication of the common cold. SIGNS AND SYMPTOMS Symptoms of otitis media may include:  Earache.  Fever.  Ringing in your ear.  Headache.  Leakage of fluid from the ear. DIAGNOSIS To diagnose otitis media, your health care provider will examine your ear with an otoscope. This is an instrument that allows your health care provider to see into your ear in order to examine your eardrum. Your health care provider also will ask you questions about your symptoms. TREATMENT  Typically, otitis media resolves on its own within 3-5 days. Your health care provider may prescribe medicine to ease your symptoms of pain. If otitis media does not resolve within 5 days or is recurrent, your health care provider may prescribe antibiotic medicines if he or she suspects that a bacterial infection is the cause. HOME CARE INSTRUCTIONS   If you were prescribed an antibiotic medicine, finish it all even if you start to feel better.  Take medicines only as directed by your health care provider.  Keep all follow-up visits as directed by your health care provider. SEEK MEDICAL CARE IF:  You have otitis media only in one ear, or bleeding from your nose, or both.  You notice a lump on your neck.  You are not getting better in 3-5 days.  You feel worse instead of better. SEEK IMMEDIATE MEDICAL CARE IF:   You have pain that is not controlled with  medicine.  You have swelling, redness, or pain around your ear or stiffness in your neck.  You notice that part of your face is paralyzed.  You notice that the bone behind your ear (mastoid) is tender when you touch it. MAKE SURE YOU:   Understand these instructions.  Will watch your condition.  Will get help right away if you are not doing well or get worse. Document Released: 08/23/2004 Document Revised: 04/04/2014 Document Reviewed: 06/15/2013 [facility] Patient Information 2015 [place], [place]. This information is not intended to replace advice given to you by your health care provider. Make sure you discuss any questions you have with your health care provider. Sore Throat A sore throat is pain, burning, irritation, or scratchiness of the throat. There is often pain or tenderness when swallowing or talking. A sore throat may be accompanied by other symptoms, such as coughing, sneezing, fever, and swollen neck glands. A sore throat is often the first sign of another sickness, such as a cold, flu, strep throat, or mononucleosis (commonly known as mono). Most sore throats go away without medical treatment. CAUSES  The most common causes of a sore throat include:  A viral infection, such as a cold, flu, or mono.  A bacterial infection, such as strep throat, tonsillitis, or whooping cough.  Seasonal allergies.  Dryness in the air.  Irritants, such as smoke or pollution.  Gastroesophageal reflux disease (GERD). HOME CARE INSTRUCTIONS   Only take over-the-counter medicines as directed  by your caregiver.  Drink enough fluids to keep your urine clear or pale yellow.  Rest as needed.  Try using throat sprays, lozenges, or sucking on hard candy to ease any pain (if older than 4 years or as directed).  Sip warm liquids, such as broth, herbal tea, or warm water with honey to relieve pain temporarily. You may also eat or drink cold or frozen liquids such as frozen ice pops.  Gargle  with salt water (mix 1 tsp salt with 8 oz of water).  Do not smoke and avoid secondhand smoke.  Put a cool-mist humidifier in your bedroom at night to moisten the air. You can also turn on a hot shower and sit in the bathroom with the door closed for 5-10 minutes. SEEK IMMEDIATE MEDICAL CARE IF:  You have difficulty breathing.  You are unable to swallow fluids, soft foods, or your saliva.  You have increased swelling in the throat.  Your sore throat does not get better in 7 days.  You have nausea and vomiting.  You have a fever or persistent symptoms for more than 2-3 days.  You have a fever and your symptoms suddenly get worse. MAKE SURE YOU:   Understand these instructions.  Will watch your condition.  Will get help right away if you are not doing well or get worse. Document Released: 12/26/2004 Document Revised: 11/04/2012 Document Reviewed: 07/26/2012 [facility] Patient Information 2015 [place], [place]. This information is not intended to replace advice given to you by your health care provider. Make sure you discuss any questions you have with your health care provider.

## 2014-06-22 NOTE — Progress Notes (Signed)
Subjective:  This chart was scribed for Shade Flood, MD by Modena Jansky, ED Scribe. This patient was seen in room 4 and the patient's care was started at 9:04 PM.   Patient ID: Dillon Guardian., male    DOB: 1981/08/16, 33 y.o.   MRN: [id number]  HPI HPI Comments: Dillon Stfort. is a 33 y.o. male who presents to the [facility] complaining of a possible exposure to an STD.  Pt has a hx of HIV. Followed by Dr. Jenelle Mages. Was seen by Dr. Cleta Alberts on July 1st after possible exposure to gonorrhea. Was treated with 250 mg rocephin then zithromax 1 g to be take after completion of amoxicillin for a sinus infection dx June 30. His testing for RPR GC/CT and Hep C antibody were negative.   He states that his sore throat has not improve in the last 2 weeks. He was told by Dr. Jenelle Mages that he may have to be treated again. He states that he noticed white spots on his throat. He reports no hx of herpes. He states that he has no other hx of any STD except HIV and gonorrhea. He states that he has no sinus problems. He denies any ear pain but does use Q tips frequently. No ear discharge or bleeding.   He reports oral sexual activity 2-3 days after July visit with same partner who was treated before for gonorrhea. No other new partners.   Patient Active Problem List   Diagnosis Date Noted  . Anal fissure 02/10/2013  . External hemorrhoids 02/10/2013  . Depressive disorder 02/07/2013  . 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 HIV (human immunodeficiency virus infection) 11/03/2012  . Anxiety state 10/02/2012  . Human immunodeficiency virus (HIV) disease 11/12/2010   Past Medical History  Diagnosis Date  . HIV disease   . GERD (gastroesophageal reflux disease)   . Ulcer    Past Surgical History  Procedure Laterality Date  . Wisdom tooth extraction     Allergies  Allergen Reactions  . Ciprofloxacin Diarrhea and Nausea Only  . Dapsone Other (See Comments)    G6PD deficiency    . Primaquine     Other reaction(s): Other (See Comments) G6PD deficiency   Prior to Admission medications   Medication Sig Start Date End Date Taking? Authorizing Provider  darunavir (PREZISTA) 400 MG tablet Take 800 mg by mouth daily. Through a study at [facility]   Yes Historical Provider, MD  emtricitabine-tenofovir (TRUVADA) 200-300 MG per tablet Take 1 tablet by mouth daily. Through a study at [facility]   Yes Historical Provider, MD  omeprazole (PRILOSEC) 20 MG capsule Take 20 mg by mouth daily.   Yes Historical Provider, MD  STUDY MEDICATION Take 1 tablet by mouth daily. Cobicistat 150 mg through [facility]   Yes Historical Provider, MD   History   Social History  . Marital Status: Single    Spouse Name: N/A    Number of Children: N/A  . Years of Education: N/A   Occupational History  . Not on file.   Social History Main Topics  . Smoking status: Former Games developer  . Smokeless tobacco: Never Used  . Alcohol Use: Yes     Comment: 2 x week  . Drug Use: Yes     Comment: marijuana  . Sexual Activity: Yes   Other Topics Concern  . Not on file   Social History Narrative  . No narrative  on file     Review of Systems  Constitutional: Negative for fever.  HENT: Positive for sore throat. Negative for congestion, ear pain, rhinorrhea and sneezing.   Respiratory: Negative for cough.   Genitourinary: Negative for discharge, difficulty urinating and genital sores.  Skin: Negative for rash.      Objective:   Physical Exam  Constitutional: He appears well-developed and well-nourished. No distress.  HENT:  Head: Normocephalic and atraumatic.  Left ear has minimal erythema. Scarring on anterior TM. Small effusion at the base.  Minimal posterior erythema on the post oropharynx, with one small tonsillith in recess of r tonsil. No exudate, no vesicles.   Neck: Neck supple. No tracheal deviation present.  One small tender left AC node.   Cardiovascular: Normal rate.   Pulmonary/Chest: Effort normal. No respiratory distress.  Abdominal: Soft. There is no tenderness.  Musculoskeletal: Normal range of motion.  Neurological: He is alert.  Skin: Skin is warm and dry.  Psychiatric: He has a normal mood and affect. His behavior is normal.    Filed Vitals:   06/22/14 2038  BP: 110/70  Pulse: 67  Temp: 98.4 F (36.9 C)  TempSrc: Oral  Height: 6' (1.829 m)  Weight: 174 lb (78.926 kg)  SpO2: 99%       Assessment & Plan:   Dillon Mcfayden. is a 33 y.o. male Exposure to gonorrhea - Plan: Gonococcus culture, Chlamydia culture, CANCELED: Wound culture, CANCELED: Herpes simplex virus culture, Sore throat - Plan: POCT rapid strep A, Gonococcus culture, Chlamydia culture, Culture, Group A Strep, Herpes simplex virus culture  - prior labs were normal, but with possible repeat exposure as sexually active with oral sex with prior partner (who was apparently treated by that time), will check throat cultures for GC, CT HSV, and strep.  Viral pharyngitis also possible.   -sx care for now. rtc precautions.   Left otitis media with effusion - Plan: azithromycin (ZITHROMAX) 250 MG tablet as just finished amox. Advised against use of Q tips in ear, but no apparent otitis externa at this time. rtc precautions.    Meds ordered this encounter  Medications  . azithromycin (ZITHROMAX) 250 MG tablet    Sig: Take 2 pills by mouth on day 1, then 1 pill by mouth per day on days 2 through 5.    Dispense:  5 tablet    Refill:  5   Patient Instructions  Start Z pak for your left ear infection. We will check a viral culture for herpesvirus and chlamydia and other throat culture for gonorrhea.  Salt water gargles and lozenges ok for now.  Do not place Q tips into ear.  Keep follow up with infectious disease as planned, but return to the clinic or go to the nearest emergency room if any of your symptoms worsen or new symptoms occur.   Otitis  Media Otitis media is redness, soreness, and inflammation of the middle ear. Otitis media may be caused by allergies or, most commonly, by infection. Often it occurs as a complication of the common cold. SIGNS AND SYMPTOMS Symptoms of otitis media may include:  Earache.  Fever.  Ringing in your ear.  Headache.  Leakage of fluid from the ear. DIAGNOSIS To diagnose otitis media, your health care provider will examine your ear with an otoscope. This is an instrument that allows your health care provider to see into your ear in order to examine your eardrum. Your health care provider also will ask you  questions about your symptoms. TREATMENT  Typically, otitis media resolves on its own within 3-5 days. Your health care provider may prescribe medicine to ease your symptoms of pain. If otitis media does not resolve within 5 days or is recurrent, your health care provider may prescribe antibiotic medicines if he or she suspects that a bacterial infection is the cause. HOME CARE INSTRUCTIONS   If you were prescribed an antibiotic medicine, finish it all even if you start to feel better.  Take medicines only as directed by your health care provider.  Keep all follow-up visits as directed by your health care provider. SEEK MEDICAL CARE IF:  You have otitis media only in one ear, or bleeding from your nose, or both.  You notice a lump on your neck.  You are not getting better in 3-5 days.  You feel worse instead of better. SEEK IMMEDIATE MEDICAL CARE IF:   You have pain that is not controlled with medicine.  You have swelling, redness, or pain around your ear or stiffness in your neck.  You notice that part of your face is paralyzed.  You notice that the bone behind your ear (mastoid) is tender when you touch it. MAKE SURE YOU:   Understand these instructions.  Will watch your condition.  Will get help right away if you are not doing well or get worse. Document Released:  08/23/2004 Document Revised: 04/04/2014 Document Reviewed: 06/15/2013 [facility] Patient Information 2015 [place], [place]. This information is not intended to replace advice given to you by your health care provider. Make sure you discuss any questions you have with your health care provider. Sore Throat A sore throat is pain, burning, irritation, or scratchiness of the throat. There is often pain or tenderness when swallowing or talking. A sore throat may be accompanied by other symptoms, such as coughing, sneezing, fever, and swollen neck glands. A sore throat is often the first sign of another sickness, such as a cold, flu, strep throat, or mononucleosis (commonly known as mono). Most sore throats go away without medical treatment. CAUSES  The most common causes of a sore throat include:  A viral infection, such as a cold, flu, or mono.  A bacterial infection, such as strep throat, tonsillitis, or whooping cough.  Seasonal allergies.  Dryness in the air.  Irritants, such as smoke or pollution.  Gastroesophageal reflux disease (GERD). HOME CARE INSTRUCTIONS   Only take over-the-counter medicines as directed by your caregiver.  Drink enough fluids to keep your urine clear or pale yellow.  Rest as needed.  Try using throat sprays, lozenges, or sucking on hard candy to ease any pain (if older than 4 years or as directed).  Sip warm liquids, such as broth, herbal tea, or warm water with honey to relieve pain temporarily. You may also eat or drink cold or frozen liquids such as frozen ice pops.  Gargle with salt water (mix 1 tsp salt with 8 oz of water).  Do not smoke and avoid secondhand smoke.  Put a cool-mist humidifier in your bedroom at night to moisten the air. You can also turn on a hot shower and sit in the bathroom with the door closed for 5-10 minutes. SEEK IMMEDIATE MEDICAL CARE IF:  You have difficulty breathing.  You are unable to swallow fluids, soft foods, or your  saliva.  You have increased swelling in the throat.  Your sore throat does not get better in 7 days.  You have nausea and vomiting.  You  have a fever or persistent symptoms for more than 2-3 days.  You have a fever and your symptoms suddenly get worse. MAKE SURE YOU:   Understand these instructions.  Will watch your condition.  Will get help right away if you are not doing well or get worse. Document Released: 12/26/2004 Document Revised: 11/04/2012 Document Reviewed: 07/26/2012 [facility] Patient Information 2015 [place], [place]. This information is not intended to replace advice given to you by your health care provider. Make sure you discuss any questions you have with your health care provider.     I personally performed the services described in this documentation, which was scribed in my presence. The recorded information has been reviewed and considered, and addended by me as needed.

## 2014-06-25 LAB — CULTURE, GROUP A STREP: Organism ID, Bacteria: NORMAL

## 2014-06-26 LAB — GONOCOCCUS CULTURE: ORGANISM ID, BACTERIA: NO GROWTH

## 2014-06-27 LAB — HERPES SIMPLEX VIRUS CULTURE: ORGANISM ID, BACTERIA: NOT DETECTED

## 2014-06-27 LAB — CHLAMYDIA CULTURE

## 2014-07-06 ENCOUNTER — Emergency Department (HOSPITAL_BASED_OUTPATIENT_CLINIC_OR_DEPARTMENT_OTHER): Payer: BC Managed Care – PPO

## 2014-07-06 ENCOUNTER — Emergency Department (HOSPITAL_BASED_OUTPATIENT_CLINIC_OR_DEPARTMENT_OTHER)
Admission: EM | Admit: 2014-07-06 | Discharge: 2014-07-06 | Disposition: A | Discharge status: Home / Self Care | Payer: BC Managed Care – PPO | Attending: Emergency Medicine | Admitting: Emergency Medicine

## 2014-07-06 DIAGNOSIS — Z79899 Other long term (current) drug therapy: Secondary | ICD-10-CM | POA: Insufficient documentation

## 2014-07-06 DIAGNOSIS — Z21 Asymptomatic human immunodeficiency virus [HIV] infection status: Secondary | ICD-10-CM | POA: Insufficient documentation

## 2014-07-06 DIAGNOSIS — Z872 Personal history of diseases of the skin and subcutaneous tissue: Secondary | ICD-10-CM | POA: Insufficient documentation

## 2014-07-06 DIAGNOSIS — K5900 Constipation, unspecified: Secondary | ICD-10-CM | POA: Insufficient documentation

## 2014-07-06 DIAGNOSIS — R1013 Epigastric pain: Secondary | ICD-10-CM | POA: Insufficient documentation

## 2014-07-06 DIAGNOSIS — Z87891 Personal history of nicotine dependence: Secondary | ICD-10-CM | POA: Insufficient documentation

## 2014-07-06 DIAGNOSIS — Z792 Long term (current) use of antibiotics: Secondary | ICD-10-CM | POA: Insufficient documentation

## 2014-07-06 DIAGNOSIS — K219 Gastro-esophageal reflux disease without esophagitis: Secondary | ICD-10-CM | POA: Insufficient documentation

## 2014-07-06 LAB — CBC WITH DIFFERENTIAL/PLATELET
BASOS ABS: 0 10*3/uL (ref 0.0–0.1)
Basophils Relative: 0 % (ref 0–1)
EOS PCT: 1 % (ref 0–5)
Eosinophils Absolute: 0.1 10*3/uL (ref 0.0–0.7)
HEMATOCRIT: 37.3 % — AB (ref 39.0–52.0)
HEMOGLOBIN: 12.6 g/dL — AB (ref 13.0–17.0)
LYMPHS PCT: 26 % (ref 12–46)
Lymphs Abs: 2.1 10*3/uL (ref 0.7–4.0)
MCH: 28.3 pg (ref 26.0–34.0)
MCHC: 33.8 g/dL (ref 30.0–36.0)
MCV: 83.8 fL (ref 78.0–100.0)
MONO ABS: 0.5 10*3/uL (ref 0.1–1.0)
MONOS PCT: 6 % (ref 3–12)
Neutro Abs: 5.4 10*3/uL (ref 1.7–7.7)
Neutrophils Relative %: 67 % (ref 43–77)
Platelets: 159 10*3/uL (ref 150–400)
RBC: 4.45 MIL/uL (ref 4.22–5.81)
RDW: 12.8 % (ref 11.5–15.5)
WBC: 8.2 10*3/uL (ref 4.0–10.5)

## 2014-07-06 LAB — COMPREHENSIVE METABOLIC PANEL
ALT: 21 U/L (ref 0–53)
AST: 17 U/L (ref 0–37)
Albumin: 3.9 g/dL (ref 3.5–5.2)
Alkaline Phosphatase: 75 U/L (ref 39–117)
Anion gap: 10 (ref 5–15)
BILIRUBIN TOTAL: 0.8 mg/dL (ref 0.3–1.2)
BUN: 9 mg/dL (ref 6–23)
CALCIUM: 9.6 mg/dL (ref 8.4–10.5)
CO2: 27 meq/L (ref 19–32)
CREATININE: 1.2 mg/dL (ref 0.50–1.35)
Chloride: 102 mEq/L (ref 96–112)
GFR calc Af Amer: 89 mL/min — ABNORMAL LOW (ref >90)
GFR, EST NON AFRICAN AMERICAN: 77 mL/min — AB (ref >90)
Glucose, Bld: 99 mg/dL (ref 70–99)
Potassium: 3.7 mEq/L (ref 3.7–5.3)
Sodium: 139 mEq/L (ref 137–147)
Total Protein: 7.7 g/dL (ref 6.0–8.3)

## 2014-07-06 LAB — LIPASE, BLOOD: Lipase: 24 U/L (ref 11–59)

## 2014-07-06 MED ORDER — ONDANSETRON HCL 4 MG/2ML IJ SOLN
4.0000 mg | Freq: Once | INTRAMUSCULAR | Status: AC
  Administered 2014-07-06: 4 mg via INTRAVENOUS

## 2014-07-06 MED ORDER — POLYETHYLENE GLYCOL 3350 17 G PO PACK: PACK | ORAL | Status: DC

## 2014-07-06 MED ORDER — ONDANSETRON HCL 4 MG/2ML IJ SOLN
INTRAMUSCULAR | Status: AC
  Filled 2014-07-06: qty 2

## 2014-07-06 MED ORDER — ONDANSETRON 8 MG PO TBDP: 8.0000 mg | ORAL_TABLET | Freq: Three times a day (TID) | ORAL | Status: DC | PRN

## 2014-07-06 NOTE — ED Notes (Signed)
Pt.  DROVE SELF TO ED

## 2014-07-06 NOTE — ED Notes (Signed)
Pt. Reports abd. Cramping with no gas and no bm.  Last BM was Monday and constipation.  Pt. Reports he has tried drinking epsom salt on Tues and threw it up.  Pt. Reports not eating normal.  Last ate Tues Morning.

## 2014-07-06 NOTE — Discharge Instructions (Signed)

## 2014-07-06 NOTE — ED Provider Notes (Signed)
CSN: [id number]     Arrival date & time 07/06/14  0007 History   First MD Initiated Contact with Patient 07/06/14 0320     Chief Complaint  Patient presents with  . Abdominal Cramping     (Consider location/radiation/quality/duration/timing/severity/associated sxs/prior Treatment) HPI This is a 33 year old male with a history of HIV and peptic ulcer disease. He is here with a one-day history of abdominal cramping. He states the cramping is primarily in his epigastrium but also present diffusely. The cramping is intermittent. It is moderate in severity. It has been accompanied by nausea and vomiting. He believes that he is constipated and tried taking Epsom salts but vomited this back up. His abdomen is not distended. He states he has been compliant with his HIV medications.  Past Medical History  Diagnosis Date  . HIV disease   . GERD (gastroesophageal reflux disease)   . Ulcer    Past Surgical History  Procedure Laterality Date  . Wisdom tooth extraction     Family History  Problem Relation Age of Onset  . Cancer Mother     colon  . Diabetes Father   . Hypertension Father   . Diabetes Sister   . Hypertension Sister   . Psoriasis Maternal Grandmother   . Hypertension Maternal Grandfather   . Cancer Maternal Grandfather     lung  . Diabetes Maternal Grandfather   . Diabetes Paternal Grandmother   . Alzheimer's disease Paternal Grandmother   . Cancer Paternal Grandfather     lung   History  Substance Use Topics  . Smoking status: Former Games developer  . Smokeless tobacco: Never Used  . Alcohol Use: Yes     Comment: 2 x week    Review of Systems  All other systems reviewed and are negative.   Allergies  Ciprofloxacin; Dapsone; and Primaquine  Home Medications   Prior to Admission medications   Medication Sig Start Date End Date Taking? Authorizing Provider  azithromycin (ZITHROMAX) 250 MG tablet Take 2 pills by mouth on day 1, then 1 pill by mouth per day on days 2  through 5. 06/22/14   Shade Flood, MD  darunavir (PREZISTA) 400 MG tablet Take 800 mg by mouth daily. Through a study at [facility]    Historical Provider, MD  emtricitabine-tenofovir (TRUVADA) 200-300 MG per tablet Take 1 tablet by mouth daily. Through a study at [facility]    Historical Provider, MD  omeprazole (PRILOSEC) 20 MG capsule Take 20 mg by mouth daily.    Historical Provider, MD  STUDY MEDICATION Take 1 tablet by mouth daily. Cobicistat 150 mg through [facility]    Historical Provider, MD   BP 112/74  Pulse 72  Temp(Src) 98.2 F (36.8 C) (Oral)  Resp 18  Ht 6\' 1"  (1.854 m)  Wt 175 lb (79.379 kg)  BMI 23.09 kg/m2  SpO2 98%  Physical Exam General: Well-developed, well-nourished male in no acute distress; appearance consistent with age of record HENT: normocephalic; atraumatic Eyes: pupils equal, round and reactive to light; extraocular muscles intact Neck: supple Heart: regular rate and rhythm Lungs: clear to auscultation bilaterally Abdomen: soft; nondistended; diffuse tenderness, most prominent in the left upper quadrant but sparing the right lower quadrant; no masses or hepatosplenomegaly; bowel sounds present Extremities: No deformity; full range of motion; pulses normal Neurologic: Awake, alert and oriented; motor function intact in all extremities and symmetric; no facial droop Skin: Warm and dry Psychiatric: Normal mood and affect  ED Course  Procedures (including critical care time)  MDM   Nursing notes and vitals signs, including pulse oximetry, reviewed.  Summary of this visit's results, reviewed by myself:  Labs:  Results for orders placed during the hospital encounter of 07/06/14 (from the past 24 hour(s))  CBC WITH DIFFERENTIAL     Status: Abnormal   Collection Time    07/06/14  3:50 AM      Result Value Ref Range   WBC 8.2  4.0 - 10.5 K/uL   RBC 4.45  4.22 - 5.81 MIL/uL   Hemoglobin 12.6 (*) 13.0 - 17.0  g/dL   HCT 40.937.3 (*) 81.139.0 - 91.452.0 %   MCV 83.8  78.0 - 100.0 fL   MCH 28.3  26.0 - 34.0 pg   MCHC 33.8  30.0 - 36.0 g/dL   RDW 78.212.8  95.611.5 - 21.315.5 %   Platelets 159  150 - 400 K/uL   Neutrophils Relative % 67  43 - 77 %   Neutro Abs 5.4  1.7 - 7.7 K/uL   Lymphocytes Relative 26  12 - 46 %   Lymphs Abs 2.1  0.7 - 4.0 K/uL   Monocytes Relative 6  3 - 12 %   Monocytes Absolute 0.5  0.1 - 1.0 K/uL   Eosinophils Relative 1  0 - 5 %   Eosinophils Absolute 0.1  0.0 - 0.7 K/uL   Basophils Relative 0  0 - 1 %   Basophils Absolute 0.0  0.0 - 0.1 K/uL  COMPREHENSIVE METABOLIC PANEL     Status: Abnormal   Collection Time    07/06/14  3:50 AM      Result Value Ref Range   Sodium 139  137 - 147 mEq/L   Potassium 3.7  3.7 - 5.3 mEq/L   Chloride 102  96 - 112 mEq/L   CO2 27  19 - 32 mEq/L   Glucose, Bld 99  70 - 99 mg/dL   BUN 9  6 - 23 mg/dL   Creatinine, Ser 0.861.20  0.50 - 1.35 mg/dL   Calcium 9.6  8.4 - 57.810.5 mg/dL   Total Protein 7.7  6.0 - 8.3 g/dL   Albumin 3.9  3.5 - 5.2 g/dL   AST 17  0 - 37 U/L   ALT 21  0 - 53 U/L   Alkaline Phosphatase 75  39 - 117 U/L   Total Bilirubin 0.8  0.3 - 1.2 mg/dL   GFR calc non Af Amer 77 (*) >90 mL/min   GFR calc Af Amer 89 (*) >90 mL/min   Anion gap 10  5 - 15  LIPASE, BLOOD     Status: None   Collection Time    07/06/14  3:50 AM      Result Value Ref Range   Lipase 24  11 - 59 U/L    Imaging Studies: Dg Abd Acute W/chest  07/06/2014   CLINICAL DATA:  Left-sided abdominal pain and cramping.  EXAM: ACUTE ABDOMEN SERIES (ABDOMEN 2 VIEW & CHEST 1 VIEW)  COMPARISON:  Prior radiograph from 07/31/2010 and CT from 05/03/2010.  FINDINGS: There is no evidence of dilated bowel loops or free intraperitoneal air. No radiopaque calculi or other significant radiographic abnormality is seen. Heart size and mediastinal contours are within normal limits. Both lungs are clear.  IMPRESSION: Negative abdominal radiographs.  No acute cardiopulmonary disease.    Electronically Signed   By: Rise MuBenjamin  McClintock M.D.   On: 07/06/2014 03:56  No evidence of acute abdomen per exam and studies. Will treat symptomatically.     Hanley Seamen, MD 07/06/14 [phone]

## 2014-10-16 ENCOUNTER — Encounter (HOSPITAL_BASED_OUTPATIENT_CLINIC_OR_DEPARTMENT_OTHER): Payer: Self-pay | Admitting: *Deleted

## 2014-10-16 ENCOUNTER — Emergency Department (HOSPITAL_BASED_OUTPATIENT_CLINIC_OR_DEPARTMENT_OTHER)
Admission: EM | Admit: 2014-10-16 | Discharge: 2014-10-16 | Disposition: A | Discharge status: Home / Self Care | Payer: BC Managed Care – PPO | Attending: Emergency Medicine | Admitting: Emergency Medicine

## 2014-10-16 DIAGNOSIS — Z79899 Other long term (current) drug therapy: Secondary | ICD-10-CM | POA: Insufficient documentation

## 2014-10-16 DIAGNOSIS — Z87891 Personal history of nicotine dependence: Secondary | ICD-10-CM | POA: Diagnosis not present

## 2014-10-16 DIAGNOSIS — Y9389 Activity, other specified: Secondary | ICD-10-CM | POA: Insufficient documentation

## 2014-10-16 DIAGNOSIS — K219 Gastro-esophageal reflux disease without esophagitis: Secondary | ICD-10-CM | POA: Insufficient documentation

## 2014-10-16 DIAGNOSIS — Z21 Asymptomatic human immunodeficiency virus [HIV] infection status: Secondary | ICD-10-CM | POA: Diagnosis not present

## 2014-10-16 DIAGNOSIS — Y998 Other external cause status: Secondary | ICD-10-CM | POA: Diagnosis not present

## 2014-10-16 DIAGNOSIS — Y9289 Other specified places as the place of occurrence of the external cause: Secondary | ICD-10-CM | POA: Diagnosis not present

## 2014-10-16 DIAGNOSIS — S80861A Insect bite (nonvenomous), right lower leg, initial encounter: Secondary | ICD-10-CM | POA: Diagnosis not present

## 2014-10-16 DIAGNOSIS — W57XXXA Bitten or stung by nonvenomous insect and other nonvenomous arthropods, initial encounter: Secondary | ICD-10-CM | POA: Diagnosis not present

## 2014-10-16 DIAGNOSIS — S50861A Insect bite (nonvenomous) of right forearm, initial encounter: Secondary | ICD-10-CM | POA: Diagnosis present

## 2014-10-16 DIAGNOSIS — L03113 Cellulitis of right upper limb: Secondary | ICD-10-CM | POA: Diagnosis not present

## 2014-10-16 MED ORDER — HYDROCORTISONE 1 % EX CREA: TOPICAL_CREAM | CUTANEOUS | Status: DC

## 2014-10-16 MED ORDER — CLINDAMYCIN HCL 150 MG PO CAPS: 300.0000 mg | ORAL_CAPSULE | Freq: Three times a day (TID) | ORAL | Status: DC

## 2014-10-16 MED ORDER — CLINDAMYCIN HCL 150 MG PO CAPS
300.0000 mg | ORAL_CAPSULE | Freq: Once | ORAL | Status: AC
  Administered 2014-10-16: 300 mg via ORAL
  Filled 2014-10-16: qty 2

## 2014-10-16 NOTE — Discharge Instructions (Signed)
Take clindamycin as directed until gone. Use cortisone cream as needed for itching. Refer to attached documents for more information.

## 2014-10-16 NOTE — ED Notes (Signed)
C/o insect bite x 2 to right forearm area is red and warm to touch. Onset yesterday.

## 2014-10-16 NOTE — ED Provider Notes (Signed)
CSN: [id number]     Arrival date & time 10/16/14  1913 History   First MD Initiated Contact with Patient 10/16/14 2021     Chief Complaint  Patient presents with  . Insect Bite     (Consider location/radiation/quality/duration/timing/severity/associated sxs/prior Treatment) HPI Comments: Patient is a 33 year old male who presents with a bug bite to his right forearm that he noticed today. Patient reports being bit by a spider although he did not see the spider bite him. He reports a mild aching pain to the bite area with surrounding redness. He did not try anything for symptom relief. He also has multiple scattered bug bites to his right leg. No aggravating/alleviating factors. No other associated symptoms.    Past Medical History  Diagnosis Date  . HIV disease   . GERD (gastroesophageal reflux disease)   . Ulcer    Past Surgical History  Procedure Laterality Date  . Wisdom tooth extraction     Family History  Problem Relation Age of Onset  . Cancer Mother     colon  . Diabetes Father   . Hypertension Father   . Diabetes Sister   . Hypertension Sister   . Psoriasis Maternal Grandmother   . Hypertension Maternal Grandfather   . Cancer Maternal Grandfather     lung  . Diabetes Maternal Grandfather   . Diabetes Paternal Grandmother   . Alzheimer's disease Paternal Grandmother   . Cancer Paternal Grandfather     lung   History  Substance Use Topics  . Smoking status: Former Smoker    Types: Cigars  . Smokeless tobacco: Never Used  . Alcohol Use: Yes     Comment: 2 x week    Review of Systems  Skin: Positive for wound.  All other systems reviewed and are negative.     Allergies  Ciprofloxacin; Dapsone; and Primaquine  Home Medications   Prior to Admission medications   Medication Sig Start Date End Date Taking? Authorizing Provider  omeprazole (PRILOSEC) 20 MG capsule Take 20 mg by mouth daily.   Yes Historical Provider, MD  STUDY MEDICATION Take 1 tablet  by mouth daily. [facility]at [facility]   Yes Historical Provider, MD  azithromycin [facility](ZITHROMAX) 250 MG tablet Take 2 pills by mouth on day 1, then 1 pill by mouth per day on days 2 through 5. 06/22/14   Shade FloodJeffrey R Greene, MD  darunavir (PREZISTA) 400 MG tablet Take 800 mg by mouth daily. [facility]    Historical Provider, MD  emtricitabine-tenofovir (TRUVADA) 200-300 MG per tablet Take 1 tablet by mouth daily. [facility]    Historical Provider, MD  ondansetron (ZOFRAN ODT) 8 MG disintegrating tablet Take 1 tablet (8 mg total) by mouth every 8 (eight) hours as needed. 07/06/14   Carlisle BeersJohn L Molpus, MD  polyethylene glycol (MIRALAX / GLYCOLAX) packet Take per package instructions daily as needed for constipation. 07/06/14   John L Molpus, MD   BP 136/72 mmHg  Pulse 84  Temp(Src) 98 F (36.7 C) (Oral)  Resp 16  Ht 6\' 1"  (1.854 m)  Wt 179 lb (81.194 kg)  BMI 23.62 kg/m2  SpO2 99% Physical Exam  Constitutional: He appears well-developed and well-nourished. No distress.  HENT:  Head: Normocephalic and atraumatic.  Eyes: Conjunctivae are normal.  Neck: Normal range of motion.  Cardiovascular: Normal rate and regular rhythm.  Exam reveals no gallop and no friction  rub.   No murmur heard. Pulmonary/Chest: Effort normal and breath sounds normal. He has no wheezes. He has no rales. He exhibits no tenderness.  Abdominal: Soft. There is no tenderness.  Musculoskeletal: Normal range of motion.  Neurological: He is alert.  Speech is goal-oriented. Moves limbs without ataxia.   Skin: Skin is warm and dry.  8x6cm area of erythema and warmth of right forearm with central lesion consistent with bug bite. No open wound or drainage. Scattered papules with overlying excoriations to right lower leg.   Psychiatric: He has a normal mood and affect. His behavior is normal.  Nursing note and vitals reviewed.   ED Course  Procedures  (including critical care time) Labs Review Labs Reviewed - No data to display  Imaging Review No results found.   EKG Interpretation None      MDM   Final diagnoses:  Cellulitis of right forearm    9:11 PM Patient has cellulitis of right forearm from insect bite. Vitals stable and patient afebrile. No signs of spreading infection. Patient also has multiple insect bites to lower right leg for which patient will have cortisone cream.    Emilia BeckKaitlyn Lakeesha Fontanilla, PA-C 10/16/14 2121  Rolan BuccoMelanie Belfi, MD 10/16/14 2207

## 2014-10-24 ENCOUNTER — Ambulatory Visit (INDEPENDENT_AMBULATORY_CARE_PROVIDER_SITE_OTHER): Payer: BC Managed Care – PPO | Admitting: Internal Medicine

## 2014-10-24 VITALS — BP 112/78 | HR 68 | Temp 98.3°F | Resp 16 | Ht 77.0 in | Wt 179.2 lb

## 2014-10-24 DIAGNOSIS — R197 Diarrhea, unspecified: Secondary | ICD-10-CM

## 2014-10-24 DIAGNOSIS — L03114 Cellulitis of left upper limb: Secondary | ICD-10-CM

## 2014-10-24 MED ORDER — DOXYCYCLINE HYCLATE 100 MG PO CAPS: 100.0000 mg | ORAL_CAPSULE | Freq: Two times a day (BID) | ORAL | Status: DC

## 2014-10-24 NOTE — Progress Notes (Signed)
MRN: [id number] DOB: 05/06/1981  Subjective:   Dillon GuardianLeo T Shawgo Jr. is a 33 y.o. male presenting for 1 day history of rash on his left arm. Patient reports that 2 weeks ago, he was seen for an insect bite and treated for cellulitis with Clindamycin. His cellulitis and bug bite have improved significantly and patient is still taking Clindamycin. Yesterday patient noticed two bumps on his left forearm that he thought were mosquito bites. The bumps were itchy and ~1-1.5cm in size. Overnight, it has become more of a painful itchy rash with clear redness of his forearm and new redness of the back of his left hand. Patient denies fever, drainage, pus, bleeding. He cannot recall another insect bite. Of note, patient admits diarrhea ~10BM per day in the past week, no blood in the stool, not particularly malodorous, no abdominal pain. Patient also completed recent course of penicillin for tooth extraction. Denies any other aggravating or relieving factors, no other questions or concerns.   Prior to Admission medications   Medication Sig Start Date End Date Taking? Authorizing Provider  clindamycin (CLEOCIN) 150 MG capsule Take 2 capsules (300 mg total) by mouth 3 (three) times daily. May dispense as 150mg  capsules 10/16/14  Yes Kaitlyn Szekalski, PA-C  darunavir (PREZISTA) 400 MG tablet Take 800 mg by mouth daily. [facility]   Yes Historical Provider, MD  emtricitabine-tenofovir (TRUVADA) 200-300 MG per tablet Take 1 tablet by mouth daily. [facility]   Yes Historical Provider, MD  omeprazole (PRILOSEC) 20 MG capsule Take 20 mg by mouth daily.   Yes Historical Provider, MD  penicillin v potassium (VEETID) 500 MG tablet Take 500 mg by mouth 4 (four) times daily.   Yes Historical Provider, MD  STUDY MEDICATION Take 1 tablet by mouth daily. Combination pill: Cobicistat 150 mg, darunavir 800 mg, emtricitabine 200 mg, tenofovir alefenamide 10 mg,  [facility]   Yes Historical Provider, MD    Allergies  Allergen Reactions  . Ciprofloxacin Diarrhea and Nausea Only  . Dapsone Other (See Comments)    G6PD deficiency  . Primaquine     Other reaction(s): Other (See Comments) G6PD deficiency    Past Medical History  Diagnosis Date  . HIV disease   . GERD (gastroesophageal reflux disease)   . Ulcer     Past Surgical History  Procedure Laterality Date  . Wisdom tooth extraction      ROS As in subjective.   Objective:   Vitals: BP 112/78 mmHg  Pulse 68  Temp(Src) 98.3 F (36.8 C) (Oral)  Resp 16  Ht 6\' 5"  (1.956 m)  Wt 179 lb 3.2 oz (81.285 kg)  BMI 21.25 kg/m2  SpO2 100%  Physical Exam  Constitutional: He is oriented to person, place, and time and well-developed, well-nourished, and in no distress. No distress.  Eyes: Conjunctivae are normal.  Cardiovascular: Normal rate.   Pulmonary/Chest: Effort normal. No respiratory distress.  Musculoskeletal: He exhibits edema and tenderness.  Neurological: He is alert and oriented to person, place, and time.  Skin: Skin is warm and dry. Rash noted. He is not diaphoretic. There is erythema.     Psychiatric: Affect normal.   A minute amount of clear fluid was expressed from #1 over an area the patient had been scratching. Sample was sent for culture.   Assessment and Plan :   1. Cellulitis of arm, left - Viral load reviewed and was  undetectable 04/2014 [facility] - Vital signs stable today, advised if symptoms worsen return to clinic for re-evaluation - Possible resistance to clindamycin switch to doxycycline (VIBRAMYCIN) 100 MG capsule; Take 1 capsule (100 mg total) by mouth 2 (two) times daily.  Dispense: 20 capsule; Refill: 0 - Wound culture pending  2. Diarrhea - Likely adverse effect from Clindamycin, unlikely now but consider C. diff if symptoms persist/worsen - Stop Clindamycin - Advised aggressive hydration, vital signs  stable, return to clinic if symptoms worsen, fail to resolve or as needed   Wallis BambergMario Woodie Trusty, PA-C [facility] [phone] 10/24/2014 8:13 PM I have participated in the care of this patient with the Advanced Practice Provider and agree with Diagnosis and Plan as documented. Robert P. Merla Richesoolittle, M.D.

## 2014-10-24 NOTE — Patient Instructions (Signed)

## 2014-10-27 LAB — WOUND CULTURE
GRAM STAIN: NONE SEEN
Gram Stain: NONE SEEN
Gram Stain: NONE SEEN

## 2014-10-28 ENCOUNTER — Encounter: Payer: Self-pay | Admitting: Urgent Care

## 2014-11-15 ENCOUNTER — Encounter (HOSPITAL_COMMUNITY): Payer: Self-pay | Admitting: Emergency Medicine

## 2014-11-15 ENCOUNTER — Emergency Department (HOSPITAL_COMMUNITY)
Admission: EM | Admit: 2014-11-15 | Discharge: 2014-11-16 | Disposition: A | Discharge status: Home / Self Care | Payer: BC Managed Care – PPO | Attending: Emergency Medicine | Admitting: Emergency Medicine

## 2014-11-15 DIAGNOSIS — Z21 Asymptomatic human immunodeficiency virus [HIV] infection status: Secondary | ICD-10-CM | POA: Diagnosis not present

## 2014-11-15 DIAGNOSIS — Z79899 Other long term (current) drug therapy: Secondary | ICD-10-CM | POA: Diagnosis not present

## 2014-11-15 DIAGNOSIS — Z87891 Personal history of nicotine dependence: Secondary | ICD-10-CM | POA: Diagnosis not present

## 2014-11-15 DIAGNOSIS — R21 Rash and other nonspecific skin eruption: Secondary | ICD-10-CM | POA: Diagnosis present

## 2014-11-15 DIAGNOSIS — K219 Gastro-esophageal reflux disease without esophagitis: Secondary | ICD-10-CM | POA: Insufficient documentation

## 2014-11-15 NOTE — ED Notes (Signed)
Pt from home c/o a hive like rash x 1 month. Patient report itchiness. He also reports that he has been seen multiple times for same and states "everybody keeps telling me it's insect bites" He reports taking benadryl,calamine and cortisone without relief.

## 2014-11-15 NOTE — ED Provider Notes (Signed)
CSN: [id number]     Arrival date & time 11/15/14  2319 History  This chart was scribed for non-physician practitioner, Tommy RainwaterShari A Rodolfo Gaster PA-C, working with Linwood DibblesJon Knapp, MD by Freida Busmaniana Omoyeni, ED Scribe. This patient was seen in room WTR9/WTR9 and the patient's care was started at 11:49 PM.    Chief Complaint  Patient presents with  . Rash     The history is provided by the patient. No language interpreter was used.    HPI Comments:  Dillon GuardianLeo T Lindbloom Jr. is a 33 y.o. male who presents to the Emergency Department complaining of a rash to various areas of his body  for the last month. Pt states the rash has been itchy for the last month but reports pain today. He has tried benadryl and topical cortisone without relief, and has followed up with 4 different doctors who advised pt that he may be getting bitten by insects but notes no one else at home has presented with similar symptoms. Pt reports a change in meds one month prior to onset of rash, however his infectious disease doctor did not feel change was contributory    Past Medical History  Diagnosis Date  . HIV disease   . GERD (gastroesophageal reflux disease)   . Ulcer    Past Surgical History  Procedure Laterality Date  . Wisdom tooth extraction     Family History  Problem Relation Age of Onset  . Cancer Mother     colon  . Diabetes Father   . Hypertension Father   . Diabetes Sister   . Hypertension Sister   . Psoriasis Maternal Grandmother   . Hypertension Maternal Grandfather   . Cancer Maternal Grandfather     lung  . Diabetes Maternal Grandfather   . Diabetes Paternal Grandmother   . Alzheimer's disease Paternal Grandmother   . Cancer Paternal Grandfather     lung   History  Substance Use Topics  . Smoking status: Former Smoker    Types: Cigars  . Smokeless tobacco: Never Used  . Alcohol Use: Yes     Comment: 2 x week    Review of Systems  Constitutional: Negative for fever and chills.  HENT: Negative for  congestion.   Eyes: Negative for visual disturbance.  Respiratory: Negative for shortness of breath.   Cardiovascular: Negative for chest pain and palpitations.  Gastrointestinal: Negative for abdominal pain.  Genitourinary: Negative for difficulty urinating.  Musculoskeletal: Negative for back pain.  Skin: Positive for rash.  Neurological: Negative for headaches.  Hematological: Does not bruise/bleed easily.      Allergies  Ciprofloxacin; Dapsone; and Primaquine  Home Medications   Prior to Admission medications   Medication Sig Start Date End Date Taking? Authorizing Provider  clindamycin (CLEOCIN) 150 MG capsule Take 2 capsules (300 mg total) by mouth 3 (three) times daily. May dispense as 150mg  capsules 10/16/14   Kaitlyn Szekalski, PA-C  darunavir (PREZISTA) 400 MG tablet Take 800 mg by mouth daily. [facility]    Historical Provider, MD  doxycycline (VIBRAMYCIN) 100 MG capsule Take 1 capsule (100 mg total) by mouth 2 (two) times daily. 10/24/14   Wallis BambergMario Mani, PA-C  emtricitabine-tenofovir (TRUVADA) 200-300 MG per tablet Take 1 tablet by mouth daily. [facility]    Historical Provider, MD  omeprazole (PRILOSEC) 20 MG capsule Take 20 mg by mouth daily.    Historical Provider, MD  penicillin v potassium (VEETID) 500 MG tablet  Take 500 mg by mouth 4 (four) times daily.    Historical Provider, MD  STUDY MEDICATION Take 1 tablet by mouth daily. Combination pill: Cobicistat 150 mg, darunavir 800 mg, emtricitabine 200 mg, tenofovir alefenamide 10 mg,  [facility]    Historical Provider, MD   BP 136/82 mmHg  Pulse 82  Temp(Src) 97.7 F (36.5 C) (Oral)  Resp 16  Ht 6\' 1"  (1.854 m)  Wt 182 lb (82.555 kg)  BMI 24.02 kg/m2  SpO2 100% Physical Exam  Constitutional: He is oriented to person, place, and time. He appears well-developed and well-nourished. No distress.  HENT:  Head: Normocephalic and  atraumatic.  Eyes: Conjunctivae are normal.  Cardiovascular: Normal rate.   Pulmonary/Chest: Effort normal.  Abdominal: He exhibits no distension.  Neurological: He is alert and oriented to person, place, and time.  Skin: Skin is warm.  Rash to RUE and anterior neck consisting of raised plaque-like erythematous lesions  Hives vs. Insect bite  Psychiatric: He has a normal mood and affect.  Nursing note and vitals reviewed.   ED Course  Procedures   DIAGNOSTIC STUDIES:  Oxygen Saturation is 100% on RA, normal by my interpretation.    COORDINATION OF CARE:  11:54 PM Discussed treatment plan with pt at bedside and pt agreed to plan.  Labs Review Labs Reviewed - No data to display  Imaging Review No results found.   EKG Interpretation None      MDM   Final diagnoses:  None    1. Nonspecific rash  DDx: hives vs insect bites. Will refer to allergist for testing given the persistent/recurrent nature of rash.    I personally performed the services described in this documentation, which was scribed in my presence. The recorded information has been reviewed and is accurate.     Arnoldo HookerShari A Amarachi Kotz, PA-C 11/16/14 0005  Linwood DibblesJon Knapp, MD 11/16/14 [phone]

## 2014-11-16 MED ORDER — FAMOTIDINE 20 MG PO TABS
20.0000 mg | ORAL_TABLET | Freq: Once | ORAL | Status: AC
  Administered 2014-11-16: 20 mg via ORAL
  Filled 2014-11-16: qty 1

## 2014-11-16 MED ORDER — DIPHENHYDRAMINE HCL 25 MG PO TABS: 25.0000 mg | ORAL_TABLET | Freq: Four times a day (QID) | ORAL | Status: DC

## 2014-11-16 MED ORDER — DIPHENHYDRAMINE HCL 25 MG PO CAPS
50.0000 mg | ORAL_CAPSULE | Freq: Once | ORAL | Status: AC
  Administered 2014-11-16: 50 mg via ORAL
  Filled 2014-11-16: qty 2

## 2014-11-16 MED ORDER — FAMOTIDINE 20 MG PO TABS: 20.0000 mg | ORAL_TABLET | Freq: Two times a day (BID) | ORAL | Status: DC

## 2014-11-16 MED ORDER — PREDNISONE 20 MG PO TABS
60.0000 mg | ORAL_TABLET | Freq: Once | ORAL | Status: AC
  Administered 2014-11-16: 60 mg via ORAL
  Filled 2014-11-16: qty 3

## 2014-11-16 MED ORDER — PREDNISONE 20 MG PO TABS: 60.0000 mg | ORAL_TABLET | Freq: Every day | ORAL | Status: DC

## 2014-11-16 NOTE — Discharge Instructions (Signed)
CALL DR. SHARMA'S OFFICE AND MAKE AN APPOINTMENT FOR FURTHER EVALUATION AND TREATMENT OF RECURRENT/PERSISTENT RASH.

## 2014-12-10 ENCOUNTER — Ambulatory Visit (INDEPENDENT_AMBULATORY_CARE_PROVIDER_SITE_OTHER): Payer: BLUE CROSS/BLUE SHIELD | Admitting: Emergency Medicine

## 2014-12-10 VITALS — BP 122/80 | HR 76 | Temp 98.1°F | Resp 18 | Ht 73.0 in | Wt 187.6 lb

## 2014-12-10 DIAGNOSIS — J019 Acute sinusitis, unspecified: Secondary | ICD-10-CM

## 2014-12-10 DIAGNOSIS — J029 Acute pharyngitis, unspecified: Secondary | ICD-10-CM

## 2014-12-10 LAB — POCT RAPID STREP A (OFFICE): Rapid Strep A Screen: NEGATIVE

## 2014-12-10 MED ORDER — AMOXICILLIN 875 MG PO TABS: 875.0000 mg | ORAL_TABLET | Freq: Two times a day (BID) | ORAL | Status: AC

## 2014-12-10 NOTE — Patient Instructions (Signed)

## 2014-12-10 NOTE — Progress Notes (Signed)
    MRN: [id number] DOB: 10/25/1981  Subjective:   Dillon GuardianLeo T Piloto Jr. is a 34 y.o. male with pmh of HIV presenting for 2 day history of sore throat, sinus congestion, rhinorrhea, sinus pain, intermittent tinnitus, ear fullness, itchy watery eyes, some nausea from postnasal drainage. Has tried benadryl and mucinex with some relief of his congestion. Denies fevers, difficulty swallowing, cough, chest pain, chest tightness, shortness of breath, wheezing, vomiting, abdominal pain, diarrhea. Did not travel over holidays, no sick contacts. Has had a difficult time with drastic changes in weather. Of note, [facility] end of [facility], states that his viral load, cd4 counts, wbc were all normal. He has also seen an allergist and treated for hives early 11/2014. Occasional smokes cigars (1 every other day), no alcohol use. Denies any other aggravating or relieving factors, no other questions or concerns.  Dillon Wilson has a current medication list which includes the following prescription(s): darunavir, diphenhydramine, emtricitabine-tenofovir, omeprazole, STUDY MEDICATION, and amoxicillin.  He is allergic to ciprofloxacin; dapsone; and primaquine.  Dillon Wilson  has a past medical history of HIV disease; GERD (gastroesophageal reflux disease); and Ulcer. Also  has past surgical history that includes Wisdom tooth extraction.  ROS As in subjective.  Objective:   Vitals: BP 122/80 mmHg  Pulse 76  Temp(Src) 98.1 F (36.7 C) (Oral)  Resp 18  Ht 6\' 1"  (1.854 m)  Wt 187 lb 9.6 oz (85.095 kg)  BMI 24.76 kg/m2  SpO2 100%  Physical Exam  Constitutional: He is oriented to person, place, and time and well-developed, well-nourished, and in no distress.  HENT:  Right TM injected but intact, not bulging. Left TM flat, intact, no effusions or erythema. Right nasal turbinate erythematous, edematous, whitish-yellow mucous. Postnasal drip present, without oropharyngeal exudates. Mucous membranes  moist.  Eyes: Conjunctivae and EOM are normal. Right eye exhibits no discharge. Left eye exhibits no discharge. No scleral icterus.  Neck: Normal range of motion.  Cardiovascular: Normal rate, regular rhythm, normal heart sounds and intact distal pulses.  Exam reveals no gallop and no friction rub.   No murmur heard. Pulmonary/Chest: Effort normal and breath sounds normal. No stridor. No respiratory distress. He has no wheezes. He has no rales. He exhibits no tenderness.  Abdominal: Bowel sounds are normal.  Lymphadenopathy:    He has cervical adenopathy (bilateral and anterior).  Neurological: He is alert and oriented to person, place, and time.  Skin: He is not diaphoretic.  Psychiatric: Mood and affect normal.   Results for orders placed or performed in visit on 12/10/14 (from the past 24 hour(s))  POCT rapid strep A     Status: None   Collection Time: 12/10/14  2:40 PM  Result Value Ref Range   Rapid Strep A Screen Negative Negative   Assessment and Plan :   1. Sore throat 2. Acute sinusitis, recurrence not specified, unspecified location - Empiric treatment for acute sinusitis with Amoxicillin x10 days - Tinnitus, sore throat likely due to postnasal drip, strep culture pending - Advised ibuprofen with food or 1-2 tablespoons of honey with lemon juice for sore throat, at least 32 ounces of water to help drain better - Return to clinic if symptoms worsen, fail to resolve or as needed  Wallis BambergMario Godfrey Tritschler, PA-C [facility] [phone] 12/10/2014 2:47 PM

## 2014-12-12 LAB — CULTURE, GROUP A STREP: Organism ID, Bacteria: NORMAL

## 2014-12-14 ENCOUNTER — Encounter: Payer: Self-pay | Admitting: Urgent Care

## 2015-01-25 ENCOUNTER — Ambulatory Visit (INDEPENDENT_AMBULATORY_CARE_PROVIDER_SITE_OTHER): Payer: BLUE CROSS/BLUE SHIELD | Admitting: Emergency Medicine

## 2015-01-25 VITALS — BP 116/70 | HR 76 | Temp 98.0°F | Resp 18 | Ht 73.0 in | Wt 188.0 lb

## 2015-01-25 DIAGNOSIS — R1084 Generalized abdominal pain: Secondary | ICD-10-CM

## 2015-01-25 DIAGNOSIS — K625 Hemorrhage of anus and rectum: Secondary | ICD-10-CM

## 2015-01-25 MED ORDER — DICYCLOMINE HCL 20 MG PO TABS: 20.0000 mg | ORAL_TABLET | Freq: Three times a day (TID) | ORAL | Status: DC

## 2015-01-25 MED ORDER — HYDROCORTISONE ACE-PRAMOXINE 1-1 % RE CREA: 1.0000 "application " | TOPICAL_CREAM | Freq: Two times a day (BID) | RECTAL | Status: DC

## 2015-01-25 NOTE — Progress Notes (Addendum)
   This chart was scribed for Collene GobbleSteven A Nellene Courtois, MD by Tonye RoyaltyJoshua Chen, ED Scribe. This patient was seen in room 12 and the patient's care was started at 1:05 PM.   Subjective:    Patient ID: Dillon GuardianLeo T Dileonardo Jr., male    DOB: 08/25/1981, 34 y.o.   MRN: [id number]  HPI  HPI Comments: Dillon GuardianLeo T Rotolo Jr. is a 34 y.o. male who [facility] complaining of blood in stool with onset this morning. He describes the blood as bright red and in the bowl as well as on wiping. He states he has had frequent bowel movements for the past 2 months, 5-6 times per day. He states that he has to have bowel movements within 1 hour after eating. He describes his BMs as "undigested food." He had medication change in November soon after which problems began, but his doctor told him it is not related. He states his weight has been stable. He states he has anal intercourse with his partner and uses protection very time.   Review of Systems  Constitutional: Negative for unexpected weight change.  Gastrointestinal: Positive for blood in stool.       Frequent bowel movements  All other systems reviewed and are negative.      Objective:   Physical Exam  Nursing note and vitals reviewed.  CONSTITUTIONAL: Well developed/well nourished HEAD: Normocephalic/atraumatic EYES: EOMI/PERRL ENMT: Mucous membranes moist NECK: supple no meningeal signs SPINE/BACK:entire spine nontender CV: S1/S2 noted, no murmurs/rubs/gallops noted LUNGS: Lungs are clear to auscultation bilaterally, no apparent distress ABDOMEN: soft, nontender, no rebound or guarding, no masses, bowel sounds noted throughout abdomen GU:no cva tenderness RECTAL:  1x1cm hemorrhoid at 7 o'clock, small anal tear at 12 o'clock, no active bleeding NEURO: Pt is awake/alert/appropriate, moves all extremitiesx4.  No facial droop.   EXTREMITIES: pulses normal/equal, full ROM SKIN: warm, color normal PSYCH: no abnormalities of mood noted, alert  and oriented to situation      Assessment & Plan:  I suspect his diarrhea is related to his medications. We'll treat with Bentyl 3 times a day and referral made to GI. Pres[facility] I personally performed the services described in this documentation, which was scribed in my presence. The recorded information has been reviewed and is accurate.

## 2015-01-30 ENCOUNTER — Encounter: Payer: Self-pay | Admitting: Gastroenterology

## 2015-02-24 ENCOUNTER — Emergency Department (HOSPITAL_BASED_OUTPATIENT_CLINIC_OR_DEPARTMENT_OTHER)
Admission: EM | Admit: 2015-02-24 | Discharge: 2015-02-25 | Disposition: A | Discharge status: Home / Self Care | Payer: BLUE CROSS/BLUE SHIELD | Attending: Emergency Medicine | Admitting: Emergency Medicine

## 2015-02-24 ENCOUNTER — Encounter (HOSPITAL_BASED_OUTPATIENT_CLINIC_OR_DEPARTMENT_OTHER): Payer: Self-pay | Admitting: *Deleted

## 2015-02-24 DIAGNOSIS — Z79899 Other long term (current) drug therapy: Secondary | ICD-10-CM | POA: Diagnosis not present

## 2015-02-24 DIAGNOSIS — B20 Human immunodeficiency virus [HIV] disease: Secondary | ICD-10-CM | POA: Insufficient documentation

## 2015-02-24 DIAGNOSIS — R112 Nausea with vomiting, unspecified: Secondary | ICD-10-CM

## 2015-02-24 DIAGNOSIS — K219 Gastro-esophageal reflux disease without esophagitis: Secondary | ICD-10-CM | POA: Diagnosis not present

## 2015-02-24 DIAGNOSIS — Z720 Tobacco use: Secondary | ICD-10-CM | POA: Insufficient documentation

## 2015-02-24 LAB — CBC WITH DIFFERENTIAL/PLATELET
Basophils Absolute: 0 10*3/uL (ref 0.0–0.1)
Basophils Relative: 1 % (ref 0–1)
EOS PCT: 2 % (ref 0–5)
Eosinophils Absolute: 0.1 10*3/uL (ref 0.0–0.7)
HCT: 38.7 % — ABNORMAL LOW (ref 39.0–52.0)
Hemoglobin: 13 g/dL (ref 13.0–17.0)
LYMPHS ABS: 2.4 10*3/uL (ref 0.7–4.0)
Lymphocytes Relative: 35 % (ref 12–46)
MCH: 28.8 pg (ref 26.0–34.0)
MCHC: 33.6 g/dL (ref 30.0–36.0)
MCV: 85.8 fL (ref 78.0–100.0)
MONOS PCT: 7 % (ref 3–12)
Monocytes Absolute: 0.5 10*3/uL (ref 0.1–1.0)
Neutro Abs: 3.6 10*3/uL (ref 1.7–7.7)
Neutrophils Relative %: 55 % (ref 43–77)
PLATELETS: 172 10*3/uL (ref 150–400)
RBC: 4.51 MIL/uL (ref 4.22–5.81)
RDW: 11.8 % (ref 11.5–15.5)
WBC: 6.6 10*3/uL (ref 4.0–10.5)

## 2015-02-24 LAB — COMPREHENSIVE METABOLIC PANEL
ALBUMIN: 3.9 g/dL (ref 3.5–5.2)
ALT: 26 U/L (ref 0–53)
ANION GAP: 5 (ref 5–15)
AST: 29 U/L (ref 0–37)
Alkaline Phosphatase: 56 U/L (ref 39–117)
BILIRUBIN TOTAL: 0.6 mg/dL (ref 0.3–1.2)
BUN: 14 mg/dL (ref 6–23)
CALCIUM: 8.8 mg/dL (ref 8.4–10.5)
CHLORIDE: 108 mmol/L (ref 96–112)
CO2: 26 mmol/L (ref 19–32)
CREATININE: 1 mg/dL (ref 0.50–1.35)
GFR calc Af Amer: >90 mL/min (ref >90)
GFR calc non Af Amer: >90 mL/min (ref >90)
Glucose, Bld: 108 mg/dL — ABNORMAL HIGH (ref 70–99)
Potassium: 3.5 mmol/L (ref 3.5–5.1)
Sodium: 139 mmol/L (ref 135–145)
TOTAL PROTEIN: 7.5 g/dL (ref 6.0–8.3)

## 2015-02-24 LAB — LIPASE, BLOOD: Lipase: 30 U/L (ref 11–59)

## 2015-02-24 MED ORDER — ONDANSETRON 4 MG PO TBDP: ORAL_TABLET | ORAL | Status: DC

## 2015-02-24 MED ORDER — SODIUM CHLORIDE 0.9 % IV BOLUS (SEPSIS)
2000.0000 mL | Freq: Once | INTRAVENOUS | Status: AC
  Administered 2015-02-24: 2000 mL via INTRAVENOUS

## 2015-02-24 MED ORDER — ONDANSETRON HCL 4 MG/2ML IJ SOLN
4.0000 mg | Freq: Once | INTRAMUSCULAR | Status: AC
  Administered 2015-02-24: 4 mg via INTRAVENOUS
  Filled 2015-02-24: qty 2

## 2015-02-24 MED ORDER — KETOROLAC TROMETHAMINE 30 MG/ML IJ SOLN
30.0000 mg | Freq: Once | INTRAMUSCULAR | Status: AC
  Administered 2015-02-24: 30 mg via INTRAVENOUS
  Filled 2015-02-24: qty 1

## 2015-02-24 NOTE — ED Notes (Signed)
Vomiting intermittently x 3 days.  Denies diarrhea, denies pain.

## 2015-02-24 NOTE — ED Provider Notes (Signed)
CSN: [id number]     Arrival date & time 02/24/15  1946 History   First MD Initiated Contact with Patient 02/24/15 2054     Chief Complaint  Patient presents with  . Emesis     (Consider location/radiation/quality/duration/timing/severity/associated sxs/prior Treatment) HPI  34 year old male with a history of GERD and HIV presents with nausea and vomiting for the past 4 days. States she's also been having a gradually worsening headache during this time. Headache is worse today. Headache is bitemporal. No fevers or chills. No blurry vision. No neck stiffness. Does not have any abdominal pain. States he's had a few loose stools. Friend at the bedside had flu 2 weeks ago but had more typical flu symptoms without vomiting. Patient does not think any food he ate cause this. Has taken ibuprofen with some relief. Feels nauseated right now. No urinary symptoms. Most recent T cell count > 800  Past Medical History  Diagnosis Date  . HIV disease   . GERD (gastroesophageal reflux disease)   . Ulcer    Past Surgical History  Procedure Laterality Date  . Wisdom tooth extraction     Family History  Problem Relation Age of Onset  . Cancer Mother     colon  . Diabetes Father   . Hypertension Father   . Diabetes Sister   . Hypertension Sister   . Psoriasis Maternal Grandmother   . Hypertension Maternal Grandfather   . Cancer Maternal Grandfather     lung  . Diabetes Maternal Grandfather   . Diabetes Paternal Grandmother   . Alzheimer's disease Paternal Grandmother   . Cancer Paternal Grandfather     lung   History  Substance Use Topics  . Smoking status: Current Some Day Smoker    Types: Cigars  . Smokeless tobacco: Never Used  . Alcohol Use: 0.0 oz/week    0 Standard drinks or equivalent per week     Comment: 2 x week    Review of Systems  Constitutional: Negative for fever and chills.  HENT: Negative for congestion.   Eyes: Negative for visual disturbance.  Respiratory:  Negative for cough and shortness of breath.   Cardiovascular: Negative for chest pain.  Gastrointestinal: Positive for nausea, vomiting and diarrhea. Negative for abdominal pain.  Genitourinary: Negative for dysuria.  Neurological: Positive for headaches. Negative for weakness and numbness.  All other systems reviewed and are negative.     Allergies  Ciprofloxacin; Dapsone; and Primaquine  Home Medications   Prior to Admission medications   Medication Sig Start Date End Date Taking? Authorizing Provider  darunavir (PREZISTA) 400 MG tablet Take 800 mg by mouth daily. [facility]    Historical Provider, MD  dicyclomine (BENTYL) 20 MG tablet Take 1 tablet (20 mg total) by mouth 4 (four) times daily -  before meals and at bedtime. 01/25/15   Collene GobbleSteven A Daub, MD  diphenhydrAMINE (BENADRYL) 25 MG tablet Take 1 tablet (25 mg total) by mouth every 6 (six) hours. 11/16/14   Elpidio AnisShari Upstill, PA-C  emtricitabine-tenofovir (TRUVADA) 200-300 MG per tablet Take 1 tablet by mouth daily. [facility]    Historical Provider, MD  omeprazole (PRILOSEC) 20 MG capsule Take 20 mg by mouth daily.    Historical Provider, MD  pramoxine-hydrocortisone (PROCTOCREAM-HC) 1-1 % rectal cream Place 1 application rectally 2 (two) times daily. 01/25/15   Collene GobbleSteven A Daub, MD  STUDY MEDICATION Take 1 tablet by mouth daily. Combination pill: Cobicistat  150 mg, darunavir 800 mg, emtricitabine 200 mg, tenofovir alefenamide 10 mg,  through [facility]    Historical Provider, MD   BP 137/82 mmHg  Pulse 73  Temp(Src) 98.8 F (37.1 C) (Oral)  Resp 18  Ht  (1.854 m)  Wt 190 lb (86.183 kg)  BMI 25.07 kg/m2  SpO2 100% Physical Exam  Constitutional: He is oriented to person, place, and time. He appears well-developed and well-nourished. No distress.  HENT:  Head: Normocephalic and atraumatic.  Right Ear: External ear normal.  Left Ear: External ear normal.   Nose: Nose normal.  Eyes: EOM are normal. Pupils are equal, round, and reactive to light. Right eye exhibits no discharge. Left eye exhibits no discharge.  Neck: Neck supple.  Cardiovascular: Normal rate, regular rhythm, normal heart sounds and intact distal pulses.   Pulmonary/Chest: Effort normal and breath sounds normal.  Abdominal: Soft. He exhibits no distension. There is no tenderness.  Musculoskeletal: He exhibits no edema.  Neurological: He is alert and oriented to person, place, and time.  CN 2-12 grossly intact. 5/5 strength in all 4 extremities. Normal gross sensation  Skin: Skin is warm and dry. He is not diaphoretic.  Nursing note and vitals reviewed.   ED Course  Procedures (including critical care time) Labs Review Labs Reviewed  COMPREHENSIVE METABOLIC PANEL - Abnormal; Notable for the following:    Glucose, Bld 108 (*)    All other components within normal limits  CBC WITH DIFFERENTIAL/PLATELET - Abnormal; Notable for the following:    HCT 38.7 (*)    All other components within normal limits  LIPASE, BLOOD    Imaging Review No results found.   EKG Interpretation None      MDM   Final diagnoses:  Nausea and vomiting in adult    Patient has a normal exam, including normal neurologic exam. He doesn't headache with a history of HIV but has a T cell count over 800 and has benign symptoms. I feel his headache is more dehydration related. No fever altered mental status. Patient feels better with Toradol, fluids, and Zofran. Will discharge with Zofran and recommend Tylenol and ibuprofen when necessary. Stable for discharge    Pricilla Loveless, MD 02/24/15 [phone]

## 2015-02-24 NOTE — ED Notes (Signed)
C/o n/v off and on x 3 days,  Denies pain  No diarrhea

## 2015-02-24 NOTE — Discharge Instructions (Signed)
Nausea and Vomiting °Nausea is a sick feeling that often comes before throwing up (vomiting). Vomiting is a reflex where stomach contents come out of your mouth. Vomiting can cause severe loss of body fluids (dehydration). Children and elderly adults can become dehydrated quickly, especially if they also have diarrhea. Nausea and vomiting are symptoms of a condition or disease. It is important to find the cause of your symptoms. °CAUSES  °· Direct irritation of the stomach lining. This irritation can result from increased acid production (gastroesophageal reflux disease), infection, food poisoning, taking certain medicines (such as nonsteroidal anti-inflammatory drugs), alcohol use, or tobacco use. °· Signals from the brain. These signals could be caused by a headache, heat exposure, an inner ear disturbance, increased pressure in the brain from injury, infection, a tumor, or a concussion, pain, emotional stimulus, or metabolic problems. °· An obstruction in the gastrointestinal tract (bowel obstruction). °· Illnesses such as diabetes, hepatitis, gallbladder problems, appendicitis, kidney problems, cancer, sepsis, atypical symptoms of a heart attack, or eating disorders. °· Medical treatments such as chemotherapy and radiation. °· Receiving medicine that makes you sleep (general anesthetic) during surgery. °DIAGNOSIS °Your caregiver may ask for tests to be done if the problems do not improve after a few days. Tests may also be done if symptoms are severe or if the reason for the nausea and vomiting is not clear. Tests may include: °· Urine tests. °· Blood tests. °· Stool tests. °· Cultures (to look for evidence of infection). °· X-rays or other imaging studies. °Test results can help your caregiver make decisions about treatment or the need for additional tests. °TREATMENT °You need to stay well hydrated. Drink frequently but in small amounts. You may wish to drink water, sports drinks, clear broth, or eat frozen  ice pops or gelatin dessert to help stay hydrated. When you eat, eating slowly may help prevent nausea. There are also some antinausea medicines that may help prevent nausea. °HOME CARE INSTRUCTIONS  °· Take all medicine as directed by your caregiver. °· If you do not have an appetite, do not force yourself to eat. However, you must continue to drink fluids. °· If you have an appetite, eat a normal diet unless your caregiver tells you differently. °· Eat a variety of complex carbohydrates (rice, wheat, potatoes, bread), lean meats, yogurt, fruits, and vegetables. °· Avoid high-fat foods because they are more difficult to digest. °· Drink enough water and fluids to keep your urine clear or pale yellow. °· If you are dehydrated, ask your caregiver for specific rehydration instructions. Signs of dehydration may include: °· Severe thirst. °· Dry lips and mouth. °· Dizziness. °· Dark urine. °· Decreasing urine frequency and amount. °· Confusion. °· Rapid breathing or pulse. °SEEK IMMEDIATE MEDICAL CARE IF:  °· You have blood or brown flecks (like coffee grounds) in your vomit. °· You have black or bloody stools. °· You have a severe headache or stiff neck. °· You are confused. °· You have severe abdominal pain. °· You have chest pain or trouble breathing. °· You do not urinate at least once every 8 hours. °· You develop cold or clammy skin. °· You continue to vomit for longer than 24 to 48 hours. °· You have a fever. °MAKE SURE YOU:  °· Understand these instructions. °· Will watch your condition. °· Will get help right away if you are not doing well or get worse. °Document Released: 11/18/2005 Document Revised: 02/10/2012 Document Reviewed: 04/17/2011 °ExitCare® Patient Information ©2015 [employer]. This information is not intended   to replace advice given to you by your health care provider. Make sure you discuss any questions you have with your health care provider. °General Headache Without Cause °A headache is pain  or discomfort felt around the head or neck area. The specific cause of a headache may not be found. There are many causes and types of headaches. A few common ones are: °· Tension headaches. °· Migraine headaches. °· Cluster headaches. °· Chronic daily headaches. °HOME CARE INSTRUCTIONS  °· Keep all follow-up appointments with your caregiver or any specialist referral. °· Only take over-the-counter or prescription medicines for pain or discomfort as directed by your caregiver. °· Lie down in a dark, quiet room when you have a headache. °· Keep a headache journal to find out what may trigger your migraine headaches. For example, write down: °¨ What you eat and drink. °¨ How much sleep you get. °¨ Any change to your diet or medicines. °· Try massage or other relaxation techniques. °· Put ice packs or heat on the head and neck. Use these 3 to 4 times per day for 15 to 20 minutes each time, or as needed. °· Limit stress. °· Sit up straight, and do not tense your muscles. °· Quit smoking if you smoke. °· Limit alcohol use. °· Decrease the amount of caffeine you drink, or stop drinking caffeine. °· Eat and sleep on a regular schedule. °· Get 7 to 9 hours of sleep, or as recommended by your caregiver. °· Keep lights dim if bright lights bother you and make your headaches worse. °SEEK MEDICAL CARE IF:  °· You have problems with the medicines you were prescribed. °· Your medicines are not working. °· You have a change from the usual headache. °· You have nausea or vomiting. °SEEK IMMEDIATE MEDICAL CARE IF:  °· Your headache becomes severe. °· You have a fever. °· You have a stiff neck. °· You have loss of vision. °· You have muscular weakness or loss of muscle control. °· You start losing your balance or have trouble walking. °· You feel faint or pass out. °· You have severe symptoms that are different from your first symptoms. °MAKE SURE YOU:  °· Understand these instructions. °· Will watch your condition. °· Will get help  right away if you are not doing well or get worse. °Document Released: 11/18/2005 Document Revised: 02/10/2012 Document Reviewed: 12/04/2011 °ExitCare® Patient Information ©2015 [employer]. This information is not intended to replace advice given to you by your health care provider. Make sure you discuss any questions you have with your health care provider. ° °

## 2015-03-21 ENCOUNTER — Other Ambulatory Visit: Payer: BLUE CROSS/BLUE SHIELD

## 2015-03-21 ENCOUNTER — Ambulatory Visit (INDEPENDENT_AMBULATORY_CARE_PROVIDER_SITE_OTHER): Payer: BLUE CROSS/BLUE SHIELD | Admitting: Gastroenterology

## 2015-03-21 ENCOUNTER — Encounter: Payer: Self-pay | Admitting: Gastroenterology

## 2015-03-21 VITALS — BP 116/62 | HR 80 | Ht 71.5 in | Wt 192.1 lb

## 2015-03-21 DIAGNOSIS — K529 Noninfective gastroenteritis and colitis, unspecified: Secondary | ICD-10-CM | POA: Diagnosis not present

## 2015-03-21 DIAGNOSIS — K625 Hemorrhage of anus and rectum: Secondary | ICD-10-CM

## 2015-03-21 MED ORDER — MOVIPREP 100 G PO SOLR: 1.0000 | Freq: Once | ORAL | Status: DC

## 2015-03-21 NOTE — Progress Notes (Signed)
HPI: This is a  very pleasant 34 year old man who was referred to me by Collene Gobble, MD  to evaluate chronic diarrhea .    Chief complaint is chronic diarrhea.  Non-bloody.  Occurs 4-5 times per day. LEss lately because he's been avoid eating a bit.  Usually triggered after he eats. The diarrhea is watery  He changed his HIV med late 2015, taking one pill that is multidrug instead of separate drugs.   Stomach issues: chronic diarrhea, with cramping in abd,   This started 3-4 months ago.  No recent travel, no sick contacts.  Had stool testing through [facility] [place]: c. Diff defintietyly negative.  Sees ID for HIV positive  Mother had colon cancer, died in her 3s from it.    Review of systems: Pertinent positive and negative review of systems were noted in the above HPI section. Complete review of systems was performed and was otherwise normal.   Past Medical History  Diagnosis Date  . HIV disease   . GERD (gastroesophageal reflux disease)   . Ulcer     Past Surgical History  Procedure Laterality Date  . Wisdom tooth extraction      Current Outpatient Prescriptions  Medication Sig Dispense Refill  . darunavir (PREZISTA) 400 MG tablet Take 800 mg by mouth daily. Through a study at [facility]    . diphenhydrAMINE (BENADRYL) 25 MG tablet Take 1 tablet (25 mg total) by mouth every 6 (six) hours. (Patient taking differently: Take 25 mg by mouth as needed. ) 20 tablet 0  . emtricitabine-tenofovir (TRUVADA) 200-300 MG per tablet Take 1 tablet by mouth daily. Through a study at [facility]    . omeprazole (PRILOSEC) 20 MG capsule Take 20 mg by mouth daily.    Marland Kitchen PREVIDENT 1.1 % GEL dental gel Place 1 application onto teeth daily.   0  . STUDY MEDICATION Take 1 tablet by mouth daily. Combination pill: Cobicistat 150 mg, darunavir 800 mg, emtricitabine 200 mg, tenofovir alefenamide 10 mg,  through [facility]     No current facility-administered  medications for this visit.    Allergies as of 03/21/2015 - Review Complete 03/21/2015  Allergen Reaction Noted  . Ciprofloxacin Diarrhea and Nausea Only 07/12/2012  . Dapsone Other (See Comments) 02/07/2013  . Primaquine  02/07/2013    Family History  Problem Relation Age of Onset  . Colon cancer Mother 68    Died at 81 or 31  . Diabetes Father   . Hypertension Father   . Diabetes Sister   . Hypertension Sister   . Psoriasis Maternal Grandmother   . Hypertension Maternal Grandfather   . Lung cancer Maternal Grandfather   . Diabetes Maternal Grandfather   . Diabetes Paternal Grandmother   . Alzheimer's disease Paternal Grandmother   . Lung cancer Paternal Grandfather     lung  . Colon polyps Neg Hx   . Esophageal cancer Neg Hx   . Kidney disease Neg Hx     History   Social History  . Marital Status: Single    Spouse Name: N/A  . Number of Children: 0  . Years of Education: N/A   Occupational History  . Health insurance    Social History Main Topics  . Smoking status: Current Some Day Smoker    Types: Cigars  . Smokeless tobacco: Never Used  . Alcohol Use: 0.0 oz/week    0 Standard drinks or equivalent per week  Comment: 2 x week  . Drug Use: Yes     Comment: marijuana  . Sexual Activity: Yes   Other Topics Concern  . Not on file   Social History Narrative     Physical Exam: Ht 5' 11.5" (1.816 m)  Wt 192 lb 2 oz (87.147 kg)  BMI 26.43 kg/m2 Constitutional: generally well-appearing Psychiatric: alert and oriented x3 Eyes: extraocular movements intact Mouth: oral pharynx moist, no lesions Neck: supple no lymphadenopathy Cardiovascular: heart regular rate and rhythm Lungs: clear to auscultation bilaterally Abdomen: soft, nontender, nondistended, no obvious ascites, no peritoneal signs, normal bowel sounds Extremities: no lower extremity edema bilaterally Skin: no lesions on visible extremities   Assessment and plan: 34 y.o. male with   chronic diarrhea, HIV-positive under good control  He is immune compromised and so susceptible to opportunistic infections however he does tell me his CD4 count is very good. Seems like he is very compliant with his HIV meds through [facility] [place] [facility] I did look up his HIV medicines and 3 of them have #1 side effect of diarrhea. He tells me these have not changed dose wise in many years however the individual pills were recently supplanted by one pill that contains all of the 3 or 4 medications previously. Perhaps that has something to do with his diarrhea. He will have a basic stool panel for GI pathogen panel today. If it is positive then will treat appropriately. We'll also schedule him for colonoscopy in 2-3 weeks from now and it can be canceled if pathogen panel clearly gives us an explanation.   Rob Buntinganiel Gwen Sarvis, MD [facility] 03/21/2015, 9:23 AM  Cc: Collene Gobbleaub, Steven A, MD

## 2015-03-21 NOTE — Patient Instructions (Addendum)
We will get records sent from your previous gastroenterologist [facility]) for review.  This will include any endoscopic (colonoscopy or upper endoscopy) procedures and any associated pathology reports.   You will have labs checked today in the basement lab.  Please head down after you check out with the front desk  (stool for GI pathogen panel). You will be set up for a colonoscopy in 2-3 weeks for chronic diarrhea.

## 2015-03-23 LAB — GASTROINTESTINAL PATHOGEN PANEL PCR
C. DIFFICILE TOX A/B, PCR: NEGATIVE
CAMPYLOBACTER, PCR: NEGATIVE
Cryptosporidium, PCR: NEGATIVE
E COLI 0157, PCR: NEGATIVE
E coli (ETEC) LT/ST PCR: NEGATIVE
E coli (STEC) stx1/stx2, PCR: NEGATIVE
GIARDIA LAMBLIA, PCR: NEGATIVE
Norovirus, PCR: NEGATIVE
ROTAVIRUS, PCR: NEGATIVE
Salmonella, PCR: NEGATIVE
Shigella, PCR: NEGATIVE

## 2015-03-27 ENCOUNTER — Encounter: Payer: Self-pay | Admitting: Gastroenterology

## 2015-03-29 ENCOUNTER — Encounter: Payer: Self-pay | Admitting: Gastroenterology

## 2015-04-26 ENCOUNTER — Telehealth: Payer: Self-pay | Admitting: Gastroenterology

## 2015-04-27 NOTE — Telephone Encounter (Signed)
Last office visit note was faxed to Dr Jenelle MagesHairston and pt has been notified

## 2015-05-02 ENCOUNTER — Telehealth: Payer: Self-pay | Admitting: Gastroenterology

## 2015-05-02 ENCOUNTER — Telehealth: Payer: Self-pay | Admitting: *Deleted

## 2015-05-02 NOTE — Telephone Encounter (Signed)
Patient wanted to know if his Infectious Disease doc sent his anesthesia authorization for was sent in to us.  He also wanted to know his prep instructions since he lost his instructions.  I gave him his instructions over the phone and he gave good feedback that he understood.  The ID authorization needs to be checked and I will do that, and have anesthesia call him back if I can't find it.

## 2015-05-02 NOTE — Telephone Encounter (Signed)
I spoke with [name].  I am completely unaware of any anesthesia forms to fax to his ID MD.  Chales AbrahamsPatty Lewis, CMA is not here today.  [name] will call the patient back and get more information from the patient

## 2015-05-02 NOTE — Telephone Encounter (Signed)
Called and left Sherri a message regarding the patient's need for a completed form by his ID doc.  The fax # is in the computer, but I also gave it to Sherri in my VM to her.

## 2015-05-02 NOTE — Telephone Encounter (Signed)
I spoke with the patient this am and we spoke of an "anesthesia form" that his ID doc had to fill out.  The patient wanted to know if we had received it yet.  I don't know anything about an anesthesia form so I asked PJ. She had no idea so I asked Sheri, and she had no idea either. Finally, I called Dr. Christella HartiganJAcobs, and he said for me to send him a note explaining the problem. He said that he would call the patient.  This message was routed to Dr. Christella HartiganJacobs at this time.

## 2015-05-02 NOTE — Telephone Encounter (Signed)
I spoke with [name] and notified her that I have no knowledge of any anesthesia forms.  I asked that she get more information from the patient and then speak with Dr. Christella HartiganJacobs.

## 2015-05-02 NOTE — Telephone Encounter (Signed)
Pt said the ID Doc has not received the form that they need to complete and need it faxed over today  Fax# [phone]  Attn: Kyung RuddLibby Mosley Pt is requesting a call once it is done

## 2015-05-02 NOTE — Telephone Encounter (Signed)
I spoke with him about his colonoscopy that is scheduled for tomorrow. He tells me that his infectious disease has told him he needs to "sign off on any anesthesia" that he is scheduled to have. He tells me he has never had trouble with any kind of sedation or anesthesia. He is not clear as to actually why his infectious disease doctor asked him that. He thinks it is because of his HIV. I explained to him that we take care of HIV patients frequently and that there is no routine forms that need to be filled out in terms of sedation. I did ask him that he contact his infectious disease doctor today and that if there are any specific concerns that they have a should call my office today to discuss it. If we do not hear from him or his infectious disease doctors we will assume that we are okay to continue with plans for colonoscopy tomorrow.

## 2015-05-03 ENCOUNTER — Ambulatory Visit (AMBULATORY_SURGERY_CENTER): Payer: BLUE CROSS/BLUE SHIELD | Admitting: Gastroenterology

## 2015-05-03 ENCOUNTER — Encounter: Payer: Self-pay | Admitting: Gastroenterology

## 2015-05-03 VITALS — BP 106/62 | HR 56 | Temp 97.6°F | Resp 31 | Ht 71.5 in | Wt 192.0 lb

## 2015-05-03 DIAGNOSIS — K529 Noninfective gastroenteritis and colitis, unspecified: Secondary | ICD-10-CM

## 2015-05-03 DIAGNOSIS — K625 Hemorrhage of anus and rectum: Secondary | ICD-10-CM

## 2015-05-03 MED ORDER — SODIUM CHLORIDE 0.9 % IV SOLN: 500.0000 mL | INTRAVENOUS | Status: DC

## 2015-05-03 NOTE — Progress Notes (Signed)
Report to PACU, RN, vss, BBS= Clear.  

## 2015-05-03 NOTE — Patient Instructions (Signed)
YOU HAD AN ENDOSCOPIC PROCEDURE TODAY AT THE [facility]:   Refer to the procedure report that was given to you for any specific questions about what was found during the examination.  If the procedure report does not answer your questions, please call your gastroenterologist to clarify.  If you requested that your care partner not be given the details of your procedure findings, then the procedure report has been included in a sealed envelope for you to review at your convenience later.  YOU SHOULD EXPECT: Some feelings of bloating in the abdomen. Passage of more gas than usual.  Walking can help get rid of the air that was put into your GI tract during the procedure and reduce the bloating. If you had a lower endoscopy (such as a colonoscopy or flexible sigmoidoscopy) you may notice spotting of blood in your stool or on the toilet paper. If you underwent a bowel prep for your procedure, you may not have a normal bowel movement for a few days.  Please Note:  You might notice some irritation and congestion in your nose or some drainage.  This is from the oxygen used during your procedure.  There is no need for concern and it should clear up in a day or so.  SYMPTOMS TO REPORT IMMEDIATELY:   Following lower endoscopy (colonoscopy or flexible sigmoidoscopy):  Excessive amounts of blood in the stool  Significant tenderness or worsening of abdominal pains  Swelling of the abdomen that is new, acute  Fever of 100F or higher   For urgent or emergent issues, a gastroenterologist can be reached at any hour by calling [phone].  DIET: Your first meal following the procedure should be a small meal and then it is ok to progress to your normal diet. Heavy or fried foods are harder to digest and may make you feel nauseous or bloated.  Likewise, meals heavy in dairy and vegetables can increase bloating.  Drink plenty of fluids but you should avoid alcoholic beverages for 24 hours.  ACTIVITY:   You should plan to take it easy for the rest of today and you should NOT DRIVE or use heavy machinery until tomorrow (because of the sedation medicines used during the test).    FOLLOW UP: Our staff will call the number listed on your records the next business day following your procedure to check on you and address any questions or concerns that you may have regarding the information given to you following your procedure. If we do not reach you, we will leave a message.  However, if you are feeling well and you are not experiencing any problems, there is no need to return our call.  We will assume that you have returned to your regular daily activities without incident.  If any biopsies were taken you will be contacted by phone or by letter within the next 1-3 weeks.  Please call us at [phone] if you have not heard about the biopsies in 3 weeks.    SIGNATURES/CONFIDENTIALITY: You and/or your care partner have signed paperwork which will be entered into your electronic medical record.  These signatures attest to the fact that that the information above on your After Visit Summary has been reviewed and is understood.  Full responsibility of the confidentiality of this discharge information lies with you and/or your care-partner.  Next colonoscopy in 5 years. Imodium in the morning. Await pathology results.

## 2015-05-03 NOTE — Op Note (Signed)
[facility] [address] [place], [id number]   COLONOSCOPY PROCEDURE REPORT  PATIENT: Dillon Wilson, Dillon Wilson  MR#: [id number] BIRTHDATE: 08-24-1981 , 34  yrs. old GENDER: male ENDOSCOPIST: Rachael Feeaniel P Jolinda Pinkstaff, MD REFERRED AO:ZHYQBY:Kurt Lauenstein, M.D. PROCEDURE DATE:  05/03/2015 PROCEDURE:   Colonoscopy with biopsy First Screening Colonoscopy - Avg.  risk and is 50 yrs.  old or older - No.  Prior Negative Screening - Now for repeat screening. N/A  History of Adenoma - Now for follow-up colonoscopy & has been > or = to 3 yrs.  N/A  high risk ASA CLASS:   Class II INDICATIONS:chronic diarrhea (HIV +), stool pathogen panel negative, also FH of colon cancer (mother in her 3850s). MEDICATIONS: Monitored anesthesia care and Propofol 220 mg IV  DESCRIPTION OF PROCEDURE:   After the risks benefits and alternatives of the procedure were thoroughly explained, informed consent was obtained.  The digital rectal exam revealed no abnormalities of the rectum.   The LB Olympus Loaner [id number] endoscope was introduced through the anus and advanced to the terminal ileum which was intubated for a short distance. No adverse events experienced.   The quality of the prep was excellent.  The instrument was then slowly withdrawn as the colon was fully examined. Estimated blood loss is zero unless otherwise noted in this procedure report.   COLON FINDINGS: The examined terminal ileum appeared to be normal. A normal appearing cecum, ileocecal valve, and appendiceal orifice were identified.  The ascending, transverse, descending, sigmoid colon, and rectum appeared unremarkable.  Multiple random biopsies were performed using cold forceps.  Sample was obtained and sent to histology.  Retroflexed views revealed no abnormalities. The time to cecum = 2.4 Withdrawal time = 5.6   The scope was withdrawn and the procedure completed. COMPLICATIONS: There were no immediate complications.  ENDOSCOPIC  IMPRESSION: 1.   The examined terminal ileum appeared to be normal 2.   Normal colonoscopy; multiple random biopsies were performed using cold forceps  RECOMMENDATIONS: Await final pathology. Given your family history of colon cancer, you should have repeat colonoscopy in 5 years Please start OTC imodium (1 pill every morning). Some of your HIV meds can contribute to loose stools.  eSigned:  Rachael Feeaniel P Loredana Medellin, MD 05/03/2015 2:04 PM

## 2015-05-03 NOTE — Progress Notes (Signed)
Called to room to assist during endoscopic procedure.  Patient ID and intended procedure confirmed with present staff. Received instructions for my participation in the procedure from the performing physician.  

## 2015-05-04 ENCOUNTER — Telehealth: Payer: Self-pay | Admitting: *Deleted

## 2015-05-04 NOTE — Telephone Encounter (Signed)
  Follow up Call-  Call back number 05/03/2015  Post procedure Call Back phone  # [phone]  Permission to leave phone message Yes     Patient questions:  Do you have a fever, pain , or abdominal swelling? No. Pain Score  0 *  Have you tolerated food without any problems? Yes.    Have you been able to return to your normal activities? Yes.    Do you have any questions about your discharge instructions: Diet   No. Medications  No. Follow up visit  No.  Do you have questions or concerns about your Care? No.  Actions: * If pain score is 4 or above: No action needed, pain <4.

## 2015-05-09 ENCOUNTER — Encounter: Payer: Self-pay | Admitting: Gastroenterology

## 2015-05-19 ENCOUNTER — Ambulatory Visit (INDEPENDENT_AMBULATORY_CARE_PROVIDER_SITE_OTHER): Payer: BLUE CROSS/BLUE SHIELD | Admitting: Family Medicine

## 2015-05-19 VITALS — BP 102/68 | HR 82 | Temp 98.9°F | Resp 16 | Ht 72.5 in | Wt 194.0 lb

## 2015-05-19 DIAGNOSIS — F4329 Adjustment disorder with other symptoms: Secondary | ICD-10-CM

## 2015-05-19 MED ORDER — ESCITALOPRAM OXALATE 20 MG PO TABS: 20.0000 mg | ORAL_TABLET | Freq: Every day | ORAL | Status: DC

## 2015-05-19 NOTE — Patient Instructions (Signed)
Stress Stress-related medical problems are becoming increasingly common. The body has a built-in physical response to stressful situations. Faced with pressure, challenge or danger, we need to react quickly. Our bodies release hormones such as cortisol and adrenaline to help do this. These hormones are part of the "fight or flight" response and affect the metabolic rate, heart rate and blood pressure, resulting in a heightened, stressed state that prepares the body for optimum performance in dealing with a stressful situation. It is likely that early man required these mechanisms to stay alive, but usually modern stresses do not call for this, and the same hormones released in today's world can damage health and reduce coping ability. CAUSES  Pressure to perform at work, at school or in sports.  Threats of physical violence.  Money worries.  Arguments.  Family conflicts.  Divorce or separation from significant other.  Bereavement.  New job or unemployment.  Changes in location.  Alcohol or drug abuse. SOMETIMES, THERE IS NO PARTICULAR REASON FOR DEVELOPING STRESS. Almost all people are at risk of being stressed at some time in their lives. It is important to know that some stress is temporary and some is long term.  Temporary stress will go away when a situation is resolved. Most people can cope with short periods of stress, and it can often be relieved by relaxing, taking a walk or getting any type of exercise, chatting through issues with friends, or having a good night's sleep.  Chronic (long-term, continuous) stress is much harder to deal with. It can be psychologically and emotionally damaging. It can be harmful both for an individual and for friends and family. SYMPTOMS Everyone reacts to stress differently. There are some common effects that help us recognize it. In times of extreme stress, people may:  Shake uncontrollably.  Breathe faster and deeper than normal  (hyperventilate).  Vomit.  For people with asthma, stress can trigger an attack.  For some people, stress may trigger migraine headaches, ulcers, and body pain. PHYSICAL EFFECTS OF STRESS MAY INCLUDE:  Loss of energy.  Skin problems.  Aches and pains resulting from tense muscles, including neck ache, backache and tension headaches.  Increased pain from arthritis and other conditions.  Irregular heart beat (palpitations).  Periods of irritability or anger.  Apathy or depression.  Anxiety (feeling uptight or worrying).  Unusual behavior.  Loss of appetite.  Comfort eating.  Lack of concentration.  Loss of, or decreased, sex-drive.  Increased smoking, drinking, or recreational drug use.  For women, missed periods.  Ulcers, joint pain, and muscle pain. Post-traumatic stress is the stress caused by any serious accident, strong emotional damage, or extremely difficult or violent experience such as rape or war. Post-traumatic stress victims can experience mixtures of emotions such as fear, shame, depression, guilt or anger. It may include recurrent memories or images that may be haunting. These feelings can last for weeks, months or even years after the traumatic event that triggered them. Specialized treatment, possibly with medicines and psychological therapies, is available. If stress is causing physical symptoms, severe distress or making it difficult for you to function as normal, it is worth seeing your caregiver. It is important to remember that although stress is a usual part of life, extreme or prolonged stress can lead to other illnesses that will need treatment. It is better to visit a doctor sooner rather than later. Stress has been linked to the development of high blood pressure and heart disease, as well as insomnia and depression.   There is no diagnostic test for stress since everyone reacts to it differently. But a caregiver will be able to spot the physical  symptoms, such as:  Headaches.  Shingles.  Ulcers. Emotional distress such as intense worry, low mood or irritability should be detected when the doctor asks pertinent questions to identify any underlying problems that might be the cause. In case there are physical reasons for the symptoms, the doctor may also want to do some tests to exclude certain conditions. If you feel that you are suffering from stress, try to identify the aspects of your life that are causing it. Sometimes you may not be able to change or avoid them, but even a small change can have a positive ripple effect. A simple lifestyle change can make all the difference. STRATEGIES THAT CAN HELP DEAL WITH STRESS:  Delegating or sharing responsibilities.  Avoiding confrontations.  Learning to be more assertive.  Regular exercise.  Avoid using alcohol or street drugs to cope.  Eating a healthy, balanced diet, rich in fruit and vegetables and proteins.  Finding humor or absurdity in stressful situations.  Never taking on more than you know you can handle comfortably.  Organizing your time better to get as much done as possible.  Talking to friends or family and sharing your thoughts and fears.  Listening to music or relaxation tapes.  Relaxation techniques like deep breathing, meditation, and yoga.  Tensing and then relaxing your muscles, starting at the toes and working up to the head and neck. If you think that you would benefit from help, either in identifying the things that are causing your stress or in learning techniques to help you relax, see a caregiver who is capable of helping you with this. Rather than relying on medications, it is usually better to try and identify the things in your life that are causing stress and try to deal with them. There are many techniques of managing stress including counseling, psychotherapy, aromatherapy, yoga, and exercise. Your caregiver can help you determine what is best  for you. Document Released: 02/08/2003 Document Revised: 11/23/2013 Document Reviewed: 01/05/2008 ExitCare Patient Information 2015 [employer]. This information is not intended to replace advice given to you by your health care provider. Make sure you discuss any questions you have with your health care provider.  

## 2015-05-19 NOTE — Progress Notes (Signed)
° °  Subjective:    Patient ID: Dillon Wilson., male    DOB: July 07, 1981, 34 y.o.   MRN: [id number] This chart was scribed for Elvina Sidle, MD by Littie Deeds, Medical Scribe. This patient was seen in Room 10 and the patient's care was started at 8:12 AM.   HPI HPI Comments: Wladyslaw Kazmer. is a 34 y.o. male who presents to the [facility] complaining of increased stress and anxiety for the past few days. Patient has had stress and anxiety in the past, but he has not had as much stress and anxiety as he does currently. Symptoms include difficulty sleeping, irritability, headaches, nausea and extremity numbness. He attributes the stress to his partner, who is going through renal failure (attributed to family history) and has been having dialysis. The relationship itself has been going well however.  Patient works for [employer] in [place].  Review of Systems  Gastrointestinal: Positive for nausea.  Neurological: Positive for numbness and headaches.  Psychiatric/Behavioral: Positive for sleep disturbance and agitation. The patient is nervous/anxious.        Objective:   Physical Exam CONSTITUTIONAL: Well developed/well nourished HEAD: Normocephalic/atraumatic EYES: EOM/PERRL ENMT: Mucous membranes moist NECK: supple no meningeal signs, no thyromegaly SPINE: entire spine nontender CV: S1/S2 noted, no murmurs/rubs/gallops noted LUNGS: Lungs are clear to auscultation bilaterally, no apparent distress ABDOMEN: soft, nontender, no rebound or guarding GU: no cva tenderness NEURO: Pt is awake/alert, moves all extremitiesx4 EXTREMITIES: pulses normal, full ROM SKIN: warm, color normal PSYCH: no abnormalities of mood noted patient relates normally and appropriately.     Assessment & Plan:   This chart was scribed in my presence and reviewed by me personally.    ICD-9-CM ICD-10-CM   1. Stress and adjustment reaction 309.89 F43.29 escitalopram  (LEXAPRO) 20 MG tablet     Signed, Elvina Sidle, MD

## 2015-05-22 ENCOUNTER — Emergency Department (HOSPITAL_BASED_OUTPATIENT_CLINIC_OR_DEPARTMENT_OTHER)
Admission: EM | Admit: 2015-05-22 | Discharge: 2015-05-23 | Disposition: A | Discharge status: Home / Self Care | Payer: BLUE CROSS/BLUE SHIELD | Attending: Emergency Medicine | Admitting: Emergency Medicine

## 2015-05-22 ENCOUNTER — Encounter (HOSPITAL_BASED_OUTPATIENT_CLINIC_OR_DEPARTMENT_OTHER): Payer: Self-pay | Admitting: Emergency Medicine

## 2015-05-22 DIAGNOSIS — Z79899 Other long term (current) drug therapy: Secondary | ICD-10-CM | POA: Insufficient documentation

## 2015-05-22 DIAGNOSIS — Z87891 Personal history of nicotine dependence: Secondary | ICD-10-CM | POA: Insufficient documentation

## 2015-05-22 DIAGNOSIS — Z872 Personal history of diseases of the skin and subcutaneous tissue: Secondary | ICD-10-CM | POA: Insufficient documentation

## 2015-05-22 DIAGNOSIS — R1013 Epigastric pain: Secondary | ICD-10-CM | POA: Diagnosis not present

## 2015-05-22 DIAGNOSIS — Z21 Asymptomatic human immunodeficiency virus [HIV] infection status: Secondary | ICD-10-CM | POA: Insufficient documentation

## 2015-05-22 DIAGNOSIS — R112 Nausea with vomiting, unspecified: Secondary | ICD-10-CM | POA: Diagnosis not present

## 2015-05-22 DIAGNOSIS — R197 Diarrhea, unspecified: Secondary | ICD-10-CM | POA: Insufficient documentation

## 2015-05-22 DIAGNOSIS — K219 Gastro-esophageal reflux disease without esophagitis: Secondary | ICD-10-CM | POA: Diagnosis not present

## 2015-05-22 LAB — CBC WITH DIFFERENTIAL/PLATELET
BASOS PCT: 1 % (ref 0–1)
Basophils Absolute: 0.1 10*3/uL (ref 0.0–0.1)
EOS ABS: 0.2 10*3/uL (ref 0.0–0.7)
Eosinophils Relative: 2 % (ref 0–5)
HCT: 38.1 % — ABNORMAL LOW (ref 39.0–52.0)
Hemoglobin: 12.6 g/dL — ABNORMAL LOW (ref 13.0–17.0)
LYMPHS ABS: 2.7 10*3/uL (ref 0.7–4.0)
Lymphocytes Relative: 41 % (ref 12–46)
MCH: 28.6 pg (ref 26.0–34.0)
MCHC: 33.1 g/dL (ref 30.0–36.0)
MCV: 86.6 fL (ref 78.0–100.0)
Monocytes Absolute: 0.5 10*3/uL (ref 0.1–1.0)
Monocytes Relative: 7 % (ref 3–12)
NEUTROS PCT: 49 % (ref 43–77)
Neutro Abs: 3.2 10*3/uL (ref 1.7–7.7)
PLATELETS: 177 10*3/uL (ref 150–400)
RBC: 4.4 MIL/uL (ref 4.22–5.81)
RDW: 12.2 % (ref 11.5–15.5)
WBC: 6.6 10*3/uL (ref 4.0–10.5)

## 2015-05-22 LAB — URINALYSIS, ROUTINE W REFLEX MICROSCOPIC
Glucose, UA: NEGATIVE mg/dL
HGB URINE DIPSTICK: NEGATIVE
KETONES UR: NEGATIVE mg/dL
Leukocytes, UA: NEGATIVE
Nitrite: NEGATIVE
Protein, ur: NEGATIVE mg/dL
Specific Gravity, Urine: 1.028 (ref 1.005–1.030)
Urobilinogen, UA: 1 mg/dL (ref 0.0–1.0)
pH: 6 (ref 5.0–8.0)

## 2015-05-22 LAB — COMPREHENSIVE METABOLIC PANEL
ALK PHOS: 48 U/L (ref 38–126)
ALT: 17 U/L (ref 17–63)
AST: 19 U/L (ref 15–41)
Albumin: 4.1 g/dL (ref 3.5–5.0)
Anion gap: 10 (ref 5–15)
BILIRUBIN TOTAL: 1 mg/dL (ref 0.3–1.2)
BUN: 10 mg/dL (ref 6–20)
CO2: 25 mmol/L (ref 22–32)
Calcium: 8.9 mg/dL (ref 8.9–10.3)
Chloride: 104 mmol/L (ref 101–111)
Creatinine, Ser: 1.07 mg/dL (ref 0.61–1.24)
GLUCOSE: 93 mg/dL (ref 65–99)
Potassium: 3.5 mmol/L (ref 3.5–5.1)
SODIUM: 139 mmol/L (ref 135–145)
Total Protein: 8 g/dL (ref 6.5–8.1)

## 2015-05-22 LAB — LIPASE, BLOOD: LIPASE: 18 U/L — AB (ref 22–51)

## 2015-05-22 MED ORDER — ONDANSETRON 4 MG PO TBDP
4.0000 mg | ORAL_TABLET | Freq: Once | ORAL | Status: AC
  Administered 2015-05-22: 4 mg via ORAL
  Filled 2015-05-22: qty 1

## 2015-05-22 MED ORDER — ONDANSETRON 4 MG PO TBDP: ORAL_TABLET | ORAL | Status: DC

## 2015-05-22 MED ORDER — SODIUM CHLORIDE 0.9 % IV BOLUS (SEPSIS): 1000.0000 mL | Freq: Once | INTRAVENOUS | Status: DC

## 2015-05-22 MED ORDER — ONDANSETRON HCL 4 MG/2ML IJ SOLN: 4.0000 mg | Freq: Once | INTRAMUSCULAR | Status: DC

## 2015-05-22 NOTE — Discharge Instructions (Signed)

## 2015-05-22 NOTE — ED Provider Notes (Signed)
CSN: [id number]     Arrival date & time 05/22/15  2000 History  This chart was scribed for Joycie Peek, PA-C, working with Gwyneth Sprout, MD by Octavia Heir, ED Scribe. This patient was seen in room MH05/MH05 and the patient's care was started at 10:27 PM.    Chief Complaint  Patient presents with  . Nausea     The history is provided by the patient. No language interpreter was used.    HPI Comments: Dillon Wilson. is a 34 y.o. male who has hx of HIV (viral load undetectable) presents to the Emergency Department complaining of constant, gradual worsening nausea onset yesterday. Pt has associated abdominal pain that feels like "his stomach is shredding", and vomiting every thing he has eaten since this morning.  Pt notes he has not been drinking a lot of fluids. He denies fever, hematemesis, blood in stool, diarrhea, contact with sick patients.  Past Medical History  Diagnosis Date  . HIV disease   . GERD (gastroesophageal reflux disease)   . Ulcer    Past Surgical History  Procedure Laterality Date  . Wisdom tooth extraction    . Upper gastrointestinal endoscopy     Family History  Problem Relation Age of Onset  . Colon cancer Mother 43    Died at 39 or 58  . Diabetes Father   . Hypertension Father   . Diabetes Sister   . Hypertension Sister   . Psoriasis Maternal Grandmother   . Hypertension Maternal Grandfather   . Lung cancer Maternal Grandfather   . Diabetes Maternal Grandfather   . Diabetes Paternal Grandmother   . Alzheimer's disease Paternal Grandmother   . Lung cancer Paternal Grandfather     lung  . Colon polyps Neg Hx   . Esophageal cancer Neg Hx   . Kidney disease Neg Hx   . Stomach cancer Neg Hx   . Rectal cancer Neg Hx    History  Substance Use Topics  . Smoking status: Former Smoker    Types: Cigars  . Smokeless tobacco: Never Used  . Alcohol Use: 0.0 oz/week    0 Standard drinks or equivalent per week     Comment: 2 x week    Review  of Systems  A complete 10 system review of systems was obtained and all systems are negative except as noted in the HPI and PMH.    Allergies  Ciprofloxacin; Dapsone; and Primaquine  Home Medications   Prior to Admission medications   Medication Sig Start Date End Date Taking? Authorizing Provider  emtricitabine-tenofovir (TRUVADA) 200-300 MG per tablet Take 1 tablet by mouth daily. Through a study at [facility]    Historical Provider, MD  escitalopram (LEXAPRO) 20 MG tablet Take 1 tablet (20 mg total) by mouth daily. 05/19/15   Elvina Sidle, MD  omeprazole (PRILOSEC) 20 MG capsule Take 20 mg by mouth daily.    Historical Provider, MD  ondansetron (ZOFRAN ODT) 4 MG disintegrating tablet  ODT q4 hours prn nausea/vomit 05/22/15   Joycie Peek, PA-C  PREVIDENT 1.1 % GEL dental gel Place 1 application onto teeth daily.  02/07/15   Historical Provider, MD  STUDY MEDICATION Take 1 tablet by mouth daily. Combination pill: Cobicistat 150 mg, darunavir 800 mg, emtricitabine 200 mg, tenofovir alefenamide 10 mg,  through [facility]    Historical Provider, MD   Triage vitals: BP 123/67 mmHg  Pulse 63  Temp(Src) 98.5 F (36.9 C) (Oral)  Resp 16  Ht 6\' 1"  (1.854 m)  Wt 194 lb (87.998 kg)  BMI 25.60 kg/m2  SpO2 99% Physical Exam  Constitutional: He is oriented to person, place, and time. He appears well-developed and well-nourished. No distress.  HENT:  Head: Normocephalic.  Mouth/Throat: Oropharynx is clear and moist.  Eyes: Conjunctivae are normal. Pupils are equal, round, and reactive to light. No scleral icterus.  Neck: Normal range of motion. Neck supple. No thyromegaly present.  Cardiovascular: Normal rate and regular rhythm.  Exam reveals no gallop and no friction rub.   No murmur heard. Pulmonary/Chest: Effort normal and breath sounds normal. No respiratory distress. He has no wheezes. He has no rales.  Abdominal: Soft. Bowel sounds are normal. He  exhibits no distension. There is tenderness. There is no rebound, no guarding, no tenderness at McBurney's point and negative Murphy's sign.  Mild discomfort in epigastric region with palpation  Musculoskeletal: Normal range of motion.  Neurological: He is alert and oriented to person, place, and time.  Skin: Skin is warm and dry. No rash noted.  Psychiatric: He has a normal mood and affect. His behavior is normal.  Nursing note and vitals reviewed.   ED Course  Procedures  DIAGNOSTIC STUDIES: Oxygen Saturation is 99% on RA, normal by my interpretation.  COORDINATION OF CARE:  10:31 PM Discussed treatment plan which includes make sure eating/drinking, nausea medication, come back if symptoms worsening with pt at bedside and pt agreed to plan.   Labs Review Labs Reviewed  CBC WITH DIFFERENTIAL/PLATELET - Abnormal; Notable for the following:    Hemoglobin 12.6 (*)    HCT 38.1 (*)    All other components within normal limits  LIPASE, BLOOD - Abnormal; Notable for the following:    Lipase 18 (*)    All other components within normal limits  URINALYSIS, ROUTINE W REFLEX MICROSCOPIC (NOT AT [facility] - Abnormal; Notable for the following:    Color, Urine AMBER (*)    Bilirubin Urine SMALL (*)    All other components within normal limits  COMPREHENSIVE METABOLIC PANEL    Imaging Review No results found.   EKG Interpretation None     Meds given in ED:  Medications  ondansetron (ZOFRAN-ODT) disintegrating tablet 4 mg (4 mg Oral Given 05/22/15 2340)    Discharge Medication List as of 05/22/2015 11:38 PM    START taking these medications   Details  ondansetron (ZOFRAN ODT) 4 MG disintegrating tablet 4mg  ODT q4 hours prn nausea/vomit, Print       Filed Vitals:   05/22/15 2016 05/22/15 2343  BP: 123/67 111/59  Pulse: 63 63  Temp: 98.5 F (36.9 C)   TempSrc: Oral   Resp: 16 16  Height: 6\' 1"  (1.854 m)   Weight: 194 lb (87.998 kg)   SpO2: 99% 100%    MDM  Patient is  tolerating PO in the ED. Vitals stable - WNL -afebrile Pt resting comfortably in ED.  Physical exam is not concerning. No evidence of acute or surgical abdomen. No vomiting in ED. Labs noncontributory. Patient discomfort likely secondary to viral etiology. Will DC with Zofran for nausea. Encourage appropriate rehydration strategies home. I discussed all relevant lab findings and imaging results with pt and they verbalized understanding. Discussed f/u with PCP within 48 hrs and return precautions, pt very amenable to plan.  Final diagnoses:  Nausea vomiting and diarrhea   I personally performed the services described in this documentation, which was scribed in my presence. The recorded information has been  reviewed and is accurate.   Joycie Peek, PA-C 05/23/15 1610  Gwyneth Sprout, MD 05/26/15 [phone]

## 2015-05-22 NOTE — ED Notes (Signed)
nausea

## 2015-05-22 NOTE — ED Notes (Signed)
C/o abd pain that comes and goes x 2 days  w n/v onset this am

## 2015-05-23 NOTE — ED Notes (Signed)
Pt drank ginger ale without diff

## 2015-07-06 ENCOUNTER — Emergency Department (HOSPITAL_BASED_OUTPATIENT_CLINIC_OR_DEPARTMENT_OTHER)
Admission: EM | Admit: 2015-07-06 | Discharge: 2015-07-06 | Disposition: A | Discharge status: Home / Self Care | Payer: BLUE CROSS/BLUE SHIELD | Attending: Emergency Medicine | Admitting: Emergency Medicine

## 2015-07-06 ENCOUNTER — Encounter (HOSPITAL_BASED_OUTPATIENT_CLINIC_OR_DEPARTMENT_OTHER): Payer: Self-pay

## 2015-07-06 DIAGNOSIS — R200 Anesthesia of skin: Secondary | ICD-10-CM | POA: Insufficient documentation

## 2015-07-06 DIAGNOSIS — Z566 Other physical and mental strain related to work: Secondary | ICD-10-CM

## 2015-07-06 DIAGNOSIS — K219 Gastro-esophageal reflux disease without esophagitis: Secondary | ICD-10-CM | POA: Insufficient documentation

## 2015-07-06 DIAGNOSIS — Z720 Tobacco use: Secondary | ICD-10-CM | POA: Insufficient documentation

## 2015-07-06 DIAGNOSIS — Z872 Personal history of diseases of the skin and subcutaneous tissue: Secondary | ICD-10-CM | POA: Insufficient documentation

## 2015-07-06 DIAGNOSIS — Z79899 Other long term (current) drug therapy: Secondary | ICD-10-CM | POA: Insufficient documentation

## 2015-07-06 DIAGNOSIS — Z21 Asymptomatic human immunodeficiency virus [HIV] infection status: Secondary | ICD-10-CM | POA: Diagnosis not present

## 2015-07-06 DIAGNOSIS — F419 Anxiety disorder, unspecified: Secondary | ICD-10-CM | POA: Insufficient documentation

## 2015-07-06 DIAGNOSIS — Z563 Stressful work schedule: Secondary | ICD-10-CM | POA: Diagnosis not present

## 2015-07-06 LAB — BASIC METABOLIC PANEL
ANION GAP: 7 (ref 5–15)
BUN: 11 mg/dL (ref 6–20)
CO2: 28 mmol/L (ref 22–32)
CREATININE: 1.24 mg/dL (ref 0.61–1.24)
Calcium: 9.1 mg/dL (ref 8.9–10.3)
Chloride: 103 mmol/L (ref 101–111)
Glucose, Bld: 92 mg/dL (ref 65–99)
Potassium: 4.3 mmol/L (ref 3.5–5.1)
Sodium: 138 mmol/L (ref 135–145)

## 2015-07-06 LAB — CBC
HCT: 39.5 % (ref 39.0–52.0)
HEMOGLOBIN: 12.9 g/dL — AB (ref 13.0–17.0)
MCH: 28.4 pg (ref 26.0–34.0)
MCHC: 32.7 g/dL (ref 30.0–36.0)
MCV: 87 fL (ref 78.0–100.0)
Platelets: 157 10*3/uL (ref 150–400)
RBC: 4.54 MIL/uL (ref 4.22–5.81)
RDW: 12.5 % (ref 11.5–15.5)
WBC: 5.6 10*3/uL (ref 4.0–10.5)

## 2015-07-06 LAB — TROPONIN I

## 2015-07-06 MED ORDER — LORAZEPAM 1 MG PO TABS
1.0000 mg | ORAL_TABLET | Freq: Once | ORAL | Status: AC
  Administered 2015-07-06: 1 mg via ORAL
  Filled 2015-07-06: qty 1

## 2015-07-06 NOTE — ED Provider Notes (Signed)
ED ECG REPORT   Date: 07/06/2015  Rate: 69  Rhythm: normal sinus rhythm  QRS Axis: normal  Intervals: normal  ST/T Wave abnormalities: normal  Conduction Disutrbances:none  Narrative Interpretation:   Old EKG Reviewed: unchanged  Pt has trace diffuse ST elevation similar to prior EKG, likely early repol changes  I have personally reviewed the EKG tracing and agree with the computerized printout as noted.   Rolan Bucco, MD 07/06/15 [id number]

## 2015-07-06 NOTE — Discharge Instructions (Signed)
Follow-up with your primary care physician. Call to schedule an appointment to be seen as soon as possible.  Paresthesia Paresthesia is an abnormal burning or prickling sensation. This sensation is generally felt in the hands, arms, legs, or feet. However, it may occur in any part of the body. It is usually not painful. The feeling may be described as:  Tingling or numbness.  "Pins and needles."  Skin crawling.  Buzzing.  Limbs "falling asleep."  Itching. Most people experience temporary (transient) paresthesia at some time in their lives. CAUSES  Paresthesia may occur when you breathe too quickly (hyperventilation). It can also occur without any apparent cause. Commonly, paresthesia occurs when pressure is placed on a nerve. The feeling quickly goes away once the pressure is removed. For some people, however, paresthesia is a long-lasting (chronic) condition caused by an underlying disorder. The underlying disorder may be:  A traumatic, direct injury to nerves. Examples include a:  Broken (fractured) neck.  Fractured skull.  A disorder affecting the brain and spinal cord (central nervous system). Examples include:  Transverse myelitis.  Encephalitis.  Transient ischemic attack.  Multiple sclerosis.  Stroke.  Tumor or blood vessel problems, such as an arteriovenous malformation pressing against the brain or spinal cord.  A condition that damages the peripheral nerves (peripheral neuropathy). Peripheral nerves are not part of the brain and spinal cord. These conditions include:  Diabetes.  Peripheral vascular disease.  Nerve entrapment syndromes, such as carpal tunnel syndrome.  Shingles.  Hypothyroidism.  Vitamin B12 deficiencies.  Alcoholism.  Heavy metal poisoning (lead, arsenic).  Rheumatoid arthritis.  Systemic lupus erythematosus. DIAGNOSIS  Your caregiver will attempt to find the underlying cause of your paresthesia. Your caregiver may:  Take your  medical history.  Perform a physical exam.  Order various lab tests.  Order imaging tests. TREATMENT  Treatment for paresthesia depends on the underlying cause. HOME CARE INSTRUCTIONS  Avoid drinking alcohol.  You may consider massage or acupuncture to help relieve your symptoms.  Keep all follow-up appointments as directed by your caregiver. SEEK IMMEDIATE MEDICAL CARE IF:   You feel weak.  You have trouble walking or moving.  You have problems with speech or vision.  You feel confused.  You cannot control your bladder or bowel movements.  You feel numbness after an injury.  You faint.  Your burning or prickling feeling gets worse when walking.  You have pain, cramps, or dizziness.  You develop a rash. MAKE SURE YOU:  Understand these instructions.  Will watch your condition.  Will get help right away if you are not doing well or get worse. Document Released: 11/08/2002 Document Revised: 02/10/2012 Document Reviewed: 08/09/2011 [facility] Patient Information 2015 [place], [place]. This information is not intended to replace advice given to you by your health care provider. Make sure you discuss any questions you have with your health care provider.  Stress and Stress Management Stress is a normal reaction to life events. It is what you feel when life demands more than you are used to or more than you can handle. Some stress can be useful. For example, the stress reaction can help you catch the last bus of the day, study for a test, or meet a deadline at work. But stress that occurs too often or for too long can cause problems. It can affect your emotional health and interfere with relationships and normal daily activities. Too much stress can weaken your immune system and increase your risk for physical illness. If you  already have a medical problem, stress can make it worse. CAUSES  All sorts of life events may cause stress. An event that causes stress for one person  may not be stressful for another person. Major life events commonly cause stress. These may be positive or negative. Examples include losing your job, moving into a new home, getting married, having a baby, or losing a loved one. Less obvious life events may also cause stress, especially if they occur day after day or in combination. Examples include working long hours, driving in traffic, caring for children, being in debt, or being in a difficult relationship. SIGNS AND SYMPTOMS Stress may cause emotional symptoms including, the following:  Anxiety. This is feeling worried, afraid, on edge, overwhelmed, or out of control.  Anger. This is feeling irritated or impatient.  Depression. This is feeling sad, down, helpless, or guilty.  Difficulty focusing, remembering, or making decisions. Stress may cause physical symptoms, including the following:   Aches and pains. These may affect your head, neck, back, stomach, or other areas of your body.  Tight muscles or clenched jaw.  Low energy or trouble sleeping. Stress may cause unhealthy behaviors, including the following:   Eating to feel better (overeating) or skipping meals.  Sleeping too little, too much, or both.  Working too much or putting off tasks (procrastination).  Smoking, drinking alcohol, or using drugs to feel better. DIAGNOSIS  Stress is diagnosed through an assessment by your health care provider. Your health care provider will ask questions about your symptoms and any stressful life events.Your health care provider will also ask about your medical history and may order blood tests or other tests. Certain medical conditions and medicine can cause physical symptoms similar to stress. Mental illness can cause emotional symptoms and unhealthy behaviors similar to stress. Your health care provider may refer you to a mental health professional for further evaluation.  TREATMENT  Stress management is the recommended treatment for  stress.The goals of stress management are reducing stressful life events and coping with stress in healthy ways.  Techniques for reducing stressful life events include the following:  Stress identification. Self-monitor for stress and identify what causes stress for you. These skills may help you to avoid some stressful events.  Time management. Set your priorities, keep a calendar of events, and learn to say "no." These tools can help you avoid making too many commitments. Techniques for coping with stress include the following:  Rethinking the problem. Try to think realistically about stressful events rather than ignoring them or overreacting. Try to find the positives in a stressful situation rather than focusing on the negatives.  Exercise. Physical exercise can release both physical and emotional tension. The key is to find a form of exercise you enjoy and do it regularly.  Relaxation techniques. These relax the body and mind. Examples include yoga, meditation, tai chi, biofeedback, deep breathing, progressive muscle relaxation, listening to music, being out in nature, journaling, and other hobbies. Again, the key is to find one or more that you enjoy and can do regularly.  Healthy lifestyle. Eat a balanced diet, get plenty of sleep, and do not smoke. Avoid using alcohol or drugs to relax.  Strong support network. Spend time with family, friends, or other people you enjoy being around.Express your feelings and talk things over with someone you trust. Counseling or talktherapy with a mental health professional may be helpful if you are having difficulty managing stress on your own. Medicine is typically not recommended  for the treatment of stress.Talk to your health care provider if you think you need medicine for symptoms of stress. HOME CARE INSTRUCTIONS  Keep all follow-up visits as directed by your health care provider.  Take all medicines as directed by your health care  provider. SEEK MEDICAL CARE IF:  Your symptoms get worse or you start having new symptoms.  You feel overwhelmed by your problems and can no longer manage them on your own. SEEK IMMEDIATE MEDICAL CARE IF:  You feel like hurting yourself or someone else. Document Released: 05/14/2001 Document Revised: 04/04/2014 Document Reviewed: 07/13/2013 [facility] Patient Information 2015 [place], [place]. This information is not intended to replace advice given to you by your health care provider. Make sure you discuss any questions you have with your health care provider.

## 2015-07-06 NOTE — ED Notes (Signed)
C/o numbness to left arm x 1-2 hours and dizziness x 30 min

## 2015-07-06 NOTE — ED Provider Notes (Signed)
CSN: [id number]     Arrival date & time 07/06/15  1308 History   First MD Initiated Contact with Patient 07/06/15 1320     Chief Complaint  Patient presents with  . Numbness     (Consider location/radiation/quality/duration/timing/severity/associated sxs/prior Treatment) HPI Comments: 34 year old male with a past medical history of HIV and GERD presenting with sudden onset left arm numbness beginning about 2 hours prior to arrival, lasting just under 2 hours. Shortly after the numbness started, he started to feel dizzy, described as "unsteady on his feet" with associated spots in his vision. Prior to onset of symptoms, he got very stressed out by a customer at work and got into a very heated argument. States this made him feel very anxious and stressed and believes this may have set off the symptoms. Denies ever having symptoms like this in the past. Currently states he feels completely normal, just anxious and stressed. Reports a history of depression without anxiety. His partner thinks the patient is very stressed. Denies recent illness. Denies fever, chills, chest pain, shortness of breath, lightheadedness, extremity weakness, facial asymmetry or speech changes. Reports a family history of early heart disease in his father. He smokes cigars daily.  The history is provided by the patient.    Past Medical History  Diagnosis Date  . HIV disease   . GERD (gastroesophageal reflux disease)   . Ulcer    Past Surgical History  Procedure Laterality Date  . Wisdom tooth extraction    . Upper gastrointestinal endoscopy    . Colonoscopy     Family History  Problem Relation Age of Onset  . Colon cancer Mother 60    Died at 45 or 52  . Diabetes Father   . Hypertension Father   . Diabetes Sister   . Hypertension Sister   . Psoriasis Maternal Grandmother   . Hypertension Maternal Grandfather   . Lung cancer Maternal Grandfather   . Diabetes Maternal Grandfather   . Diabetes Paternal  Grandmother   . Alzheimer's disease Paternal Grandmother   . Lung cancer Paternal Grandfather     lung  . Colon polyps Neg Hx   . Esophageal cancer Neg Hx   . Kidney disease Neg Hx   . Stomach cancer Neg Hx   . Rectal cancer Neg Hx    History  Substance Use Topics  . Smoking status: Current Every Day Smoker    Types: Cigars  . Smokeless tobacco: Never Used  . Alcohol Use: 0.0 oz/week    0 Standard drinks or equivalent per week    Review of Systems  Neurological: Positive for dizziness and numbness.  Psychiatric/Behavioral: The patient is nervous/anxious.   All other systems reviewed and are negative.     Allergies  Ciprofloxacin; Dapsone; and Primaquine  Home Medications   Prior to Admission medications   Medication Sig Start Date End Date Taking? Authorizing Provider  emtricitabine-tenofovir (TRUVADA) 200-300 MG per tablet Take 1 tablet by mouth daily. Through a study at [facility]    Historical Provider, MD  escitalopram (LEXAPRO) 20 MG tablet Take 1 tablet (20 mg total) by mouth daily. 05/19/15   Elvina Sidle, MD  omeprazole (PRILOSEC) 20 MG capsule Take 20 mg by mouth daily.    Historical Provider, MD  ondansetron (ZOFRAN ODT) 4 MG disintegrating tablet  ODT q4 hours prn nausea/vomit 05/22/15   Joycie Peek, PA-C  PREVIDENT 1.1 % GEL dental gel Place 1 application onto teeth daily.  02/07/15   Historical  Provider, MD  STUDY MEDICATION Take 1 tablet by mouth daily. Combination pill: Cobicistat 150 mg, darunavir 800 mg, emtricitabine 200 mg, tenofovir alefenamide 10 mg,  through [facility]    Historical Provider, MD   BP 112/64 mmHg  Pulse 66  Temp(Src) 98.5 F (36.9 C) (Oral)  Resp 16  Ht 6\' 1"  (1.854 m)  Wt 195 lb (88.451 kg)  BMI 25.73 kg/m2  SpO2 100% Physical Exam  Constitutional: He is oriented to person, place, and time. He appears well-developed and well-nourished. No distress.  HENT:  Head: Normocephalic and atraumatic.   Mouth/Throat: Oropharynx is clear and moist.  Eyes: Conjunctivae and EOM are normal. Pupils are equal, round, and reactive to light.  Neck: Normal range of motion. Neck supple. No JVD present.  Cardiovascular: Normal rate, regular rhythm, normal heart sounds and intact distal pulses.   Pulmonary/Chest: Effort normal and breath sounds normal. No respiratory distress.  Abdominal: Soft. Bowel sounds are normal. There is no tenderness.  Musculoskeletal: Normal range of motion. He exhibits no edema.  Neurological: He is alert and oriented to person, place, and time. He has normal strength. No cranial nerve deficit or sensory deficit. Coordination and gait normal. GCS eye subscore is 4. GCS verbal subscore is 5. GCS motor subscore is 6.  Speech fluent, goal oriented. Moves extremities without ataxia. No pronator drift. Normal finger to nose bilateral.  Skin: Skin is warm and dry. He is not diaphoretic.  Psychiatric: He has a normal mood and affect. His behavior is normal.  Nursing note and vitals reviewed.   ED Course  Procedures (including critical care time) Labs Review Labs Reviewed  CBC - Abnormal; Notable for the following:    Hemoglobin 12.9 (*)    All other components within normal limits  BASIC METABOLIC PANEL  TROPONIN I    Imaging Review No results found.   EKG Interpretation None      MDM   Final diagnoses:  Stress at work  Left arm numbness   Non-toxic appearing, NAD. AFVSS. Asymptomatic in ED other than stress/anxiety. No focal neuro deficits. Symptoms began after being very stressed with a customer. It sounds like he may have had a panic attack. Very low suspicion for CVA/TIA. Labs without acute finding. EKG unchanged. Reports feeling better after receiving Ativan. I advised the patient to follow-up with PCP to discuss stress management along with panic attack symptoms. Advised him to return to the ED with any returning symptoms. Stable for discharge. Return  precautions given. Patient states understanding of treatment care plan and is agreeable.  Kathrynn Speed, PA-C 07/06/15 1610  Rolan Bucco, MD 07/07/15 [phone]

## 2015-10-31 ENCOUNTER — Ambulatory Visit (INDEPENDENT_AMBULATORY_CARE_PROVIDER_SITE_OTHER): Payer: BLUE CROSS/BLUE SHIELD | Admitting: Family Medicine

## 2015-10-31 VITALS — BP 110/70 | HR 85 | Temp 99.2°F | Resp 16 | Ht 73.0 in | Wt 201.0 lb

## 2015-10-31 DIAGNOSIS — K219 Gastro-esophageal reflux disease without esophagitis: Secondary | ICD-10-CM | POA: Diagnosis not present

## 2015-10-31 DIAGNOSIS — Z23 Encounter for immunization: Secondary | ICD-10-CM

## 2015-10-31 DIAGNOSIS — G479 Sleep disorder, unspecified: Secondary | ICD-10-CM | POA: Diagnosis not present

## 2015-10-31 DIAGNOSIS — F4329 Adjustment disorder with other symptoms: Secondary | ICD-10-CM

## 2015-10-31 DIAGNOSIS — F1994 Other psychoactive substance use, unspecified with psychoactive substance-induced mood disorder: Secondary | ICD-10-CM

## 2015-10-31 DIAGNOSIS — Z21 Asymptomatic human immunodeficiency virus [HIV] infection status: Secondary | ICD-10-CM | POA: Diagnosis not present

## 2015-10-31 DIAGNOSIS — F32A Depression, unspecified: Secondary | ICD-10-CM

## 2015-10-31 DIAGNOSIS — F329 Major depressive disorder, single episode, unspecified: Secondary | ICD-10-CM

## 2015-10-31 MED ORDER — LORAZEPAM 1 MG PO TABS: 0.5000 mg | ORAL_TABLET | Freq: Every day | ORAL | Status: DC

## 2015-10-31 MED ORDER — ESCITALOPRAM OXALATE 20 MG PO TABS: 20.0000 mg | ORAL_TABLET | Freq: Every day | ORAL | Status: DC

## 2015-10-31 NOTE — Progress Notes (Signed)
Patient ID: Dillon Coutts., male    DOB: 11/25/1981  Age: 34 y.o. MRN: [id number]  Chief Complaint  Patient presents with  . Mood Swings    x 2 months  . Depression    Positive screeningh  . Flu Vaccine    Subjective:   34 year old male who is here because of mood swings and depressive issues. He has been under a lot of stress in the home place. His partner has chronic kidney disease and is in line for separation for a kidney transplant. They have had poor communication. They've been together for a couple of years, but they have been not very good communication often centering around the financial issues but some other things about his partner that have not been very forthcoming. He also has a new job, as a Firefighter. He is not getting any regular exercise. He is been moody, does not sleep well, is anxious. He was given a prescription for Lexapro 6 months ago which he probably only took one bottle and never got it refilled. His only regular medicines are his HIV medicines and omeprazole. He has had HIV for about 4 years and has been on omeprazole for many years for his stomach. He feels like he has accepted the HIV and taken that in stride. Its other issues in life that are turning them up. He sees providers at [facility] for the management of HIV. He denies any suicidal ideation. He has not been seeing any counselors or psychologist.  Current allergies, medications, problem list, past/family and social histories reviewed.  Objective:  BP 110/70 mmHg  Pulse 85  Temp(Src) 99.2 F (37.3 C) (Oral)  Resp 16  Ht  (1.854 m)  Wt 201 lb (91.173 kg)  BMI 26.52 kg/m2  SpO2 98%  Physical exam not done today.  Assessment & Plan:   Assessment: 1. Depression   2. Drug-induced mood disorder (HCC)   3. Sleep disturbance   4. Asymptomatic HIV infection (HCC)   5. Gastroesophageal reflux disease, esophagitis presence not specified   6. Stress and adjustment reaction   7. Needs flu  shot       Plan: Discussed with him the need for making some choices in life as to what he is going to do. He needs regular exercise. I recommend counseling. I recommend some medications.  20 minutes direct face-to-face counseling, discussion of being physically, emotionally, relationally, and spiritually healthy.  Meds ordered this encounter  Medications  . escitalopram (LEXAPRO) 20 MG tablet    Sig: Take 1 tablet (20 mg total) by mouth daily.    Dispense:  30 tablet    Refill:  5  . LORazepam (ATIVAN) 1 MG tablet    Sig: Take 0.5 tablets (0.5 mg total) by mouth at bedtime.    Dispense:  30 tablet    Refill:  0         Patient Instructions  Try to get regular exercise  You need to open up more frank discussions with your partner. I would strongly recommend you seeking out a third party counselor to work with you  Take the Lexapro one pill each morning. This takes being on it for at least 2 or 3 weeks before you get to feeling like it is doing better.  Take the lorazepam 1 pill each evening before bedtime for sleep and anxiety. However if you aren't good and tired and think you're going to be able to sleep without it it is better  to not have to take it every night. Regular exercise will help you sleep also.  A couple of names of psychologists who I have used for many patients are:  Karmen Bongoaron Stewart and Penelope CoopClaude Radan  [phone] [facility]ins[facility]:  [phone]  Plan to return in 4-6 weeks for follow-up on progress on the medication. Return sooner if worse.     No Follow-up on file.   Shanzay Hepworth, MD 10/31/2015

## 2015-10-31 NOTE — Patient Instructions (Signed)
Try to get regular exercise  You need to open up more frank discussions with your partner. I would strongly recommend you seeking out a third party counselor to work with you  Take the Lexapro one pill each morning. This takes being on it for at least 2 or 3 weeks before you get to feeling like it is doing better.  Take the lorazepam 1 pill each evening before bedtime for sleep and anxiety. However if you aren't good and tired and think you're going to be able to sleep without it it is better to not have to take it every night. Regular exercise will help you sleep also.  A couple of names of psychologists who I have used for many patients are:  Karmen Bongoaron Stewart and Penelope CoopClaude Radan  [phone] [facility]ins[facility]:  [phone]  Plan to return in 4-6 weeks for follow-up on progress on the medication. Return sooner if worse.

## 2015-11-01 ENCOUNTER — Encounter (HOSPITAL_BASED_OUTPATIENT_CLINIC_OR_DEPARTMENT_OTHER): Payer: Self-pay | Admitting: *Deleted

## 2015-11-01 ENCOUNTER — Emergency Department (HOSPITAL_BASED_OUTPATIENT_CLINIC_OR_DEPARTMENT_OTHER)
Admission: EM | Admit: 2015-11-01 | Discharge: 2015-11-01 | Disposition: A | Discharge status: Home / Self Care | Payer: BLUE CROSS/BLUE SHIELD | Attending: Emergency Medicine | Admitting: Emergency Medicine

## 2015-11-01 DIAGNOSIS — F17210 Nicotine dependence, cigarettes, uncomplicated: Secondary | ICD-10-CM | POA: Insufficient documentation

## 2015-11-01 DIAGNOSIS — R42 Dizziness and giddiness: Secondary | ICD-10-CM | POA: Insufficient documentation

## 2015-11-01 DIAGNOSIS — R112 Nausea with vomiting, unspecified: Secondary | ICD-10-CM | POA: Diagnosis not present

## 2015-11-01 DIAGNOSIS — K219 Gastro-esophageal reflux disease without esophagitis: Secondary | ICD-10-CM | POA: Diagnosis not present

## 2015-11-01 DIAGNOSIS — Z79899 Other long term (current) drug therapy: Secondary | ICD-10-CM | POA: Diagnosis not present

## 2015-11-01 DIAGNOSIS — B20 Human immunodeficiency virus [HIV] disease: Secondary | ICD-10-CM | POA: Insufficient documentation

## 2015-11-01 MED ORDER — ONDANSETRON 4 MG PO TBDP
4.0000 mg | ORAL_TABLET | Freq: Once | ORAL | Status: AC
  Administered 2015-11-01: 4 mg via ORAL
  Filled 2015-11-01: qty 1

## 2015-11-01 NOTE — ED Notes (Signed)
Patient c/o nausea and vomiting for the past two days. He states he has felt tired for the past two days

## 2015-11-01 NOTE — ED Provider Notes (Signed)
CSN: [id number]     Arrival date & time 11/01/15  0802 History   First MD Initiated Contact with Patient 11/01/15 [phone]     Chief Complaint  Patient presents with  . Emesis     (Consider location/radiation/quality/duration/timing/severity/associated sxs/prior Treatment) Patient is a 34 y.o. male presenting with vomiting.  Emesis Severity:  Moderate Duration:  1 day Timing:  Constant Number of daily episodes:  5 Quality:  Stomach contents Progression:  Unchanged Chronicity:  New Recent urination:  Normal Context: not post-tussive and not self-induced   Relieved by:  Nothing Worsened by:  Nothing tried Ineffective treatments:  None tried Associated symptoms: no abdominal pain and no fever   Risk factors: no alcohol use and no diabetes     Past Medical History  Diagnosis Date  . HIV disease (HCC)   . GERD (gastroesophageal reflux disease)   . Ulcer    Past Surgical History  Procedure Laterality Date  . Wisdom tooth extraction    . Upper gastrointestinal endoscopy    . Colonoscopy     Family History  Problem Relation Age of Onset  . Colon cancer Mother 3552    Died at 8754 or 7355  . Diabetes Father   . Hypertension Father   . Diabetes Sister   . Hypertension Sister   . Psoriasis Maternal Grandmother   . Hypertension Maternal Grandfather   . Lung cancer Maternal Grandfather   . Diabetes Maternal Grandfather   . Diabetes Paternal Grandmother   . Alzheimer's disease Paternal Grandmother   . Lung cancer Paternal Grandfather     lung  . Colon polyps Neg Hx   . Esophageal cancer Neg Hx   . Kidney disease Neg Hx   . Stomach cancer Neg Hx   . Rectal cancer Neg Hx    Social History  Substance Use Topics  . Smoking status: Current Some Day Smoker    Types: Cigars  . Smokeless tobacco: Never Used  . Alcohol Use: 0.0 oz/week    0 Standard drinks or equivalent per week    Review of Systems  Gastrointestinal: Positive for vomiting. Negative for abdominal pain.   Psychiatric/Behavioral:       Waking up in the middle of the night  All other systems reviewed and are negative.     Allergies  Ciprofloxacin; Dapsone; and Primaquine  Home Medications   Prior to Admission medications   Medication Sig Start Date End Date Taking? Authorizing Provider  emtricitabine-tenofovir (TRUVADA) 200-300 MG per tablet Take 1 tablet by mouth daily. [facility]    Historical Provider, MD  escitalopram (LEXAPRO) 20 MG tablet Take 1 tablet (20 mg total) by mouth daily. 10/31/15   Peyton Najjaravid H Hopper, MD  LORazepam (ATIVAN) 1 MG tablet Take 0.5 tablets (0.5 mg total) by mouth at bedtime. 10/31/15   Peyton Najjaravid H Hopper, MD  omeprazole (PRILOSEC) 20 MG capsule Take 20 mg by mouth daily.    Historical Provider, MD  ondansetron (ZOFRAN ODT) 4 MG disintegrating tablet 4mg  ODT q4 hours prn nausea/vomit 05/22/15   Joycie PeekBenjamin Cartner, PA-C  PREVIDENT 1.1 % GEL dental gel Place 1 application onto teeth daily.  02/07/15   Historical Provider, MD  STUDY MEDICATION Take 1 tablet by mouth daily. Combination pill: Cobicistat 150 mg, darunavir 800 mg, emtricitabine 200 mg, tenofovir alefenamide 10 mg,  [facility]    Historical Provider, MD   BP 128/78 mmHg  Pulse 77  Temp(Src) 98.9 F (37.2  C) (Oral)  Ht  (1.854 m)  Wt 201 lb (91.173 kg)  BMI 26.52 kg/m2  SpO2 98% Physical Exam  Constitutional: He is oriented to person, place, and time. He appears well-developed and well-nourished. No distress.  HENT:  Head: Normocephalic and atraumatic.  Eyes: Conjunctivae and EOM are normal.  Neck: Neck supple. No tracheal deviation present.  Cardiovascular: Normal rate, regular rhythm and normal heart sounds.   Pulmonary/Chest: Effort normal and breath sounds normal. No respiratory distress.  Abdominal: Soft. He exhibits no distension. There is no tenderness.  Neurological: He is alert and oriented to person, place, and time. He has normal  strength. No cranial nerve deficit. GCS eye subscore is 4. GCS verbal subscore is 5. GCS motor subscore is 6.  Normal finger to nose testing and rapid alternating movement. Negative head impulse test  Skin: Skin is warm and dry.  Psychiatric: He has a normal mood and affect.    ED Course  Procedures (including critical care time) Labs Review Labs Reviewed - No data to display  Imaging Review No results found. I have personally reviewed and evaluated these images and lab results as part of my medical decision-making.   EKG Interpretation None      MDM   Final diagnoses:  Non-intractable vomiting with nausea, vomiting of unspecified type    34 year old male with past medical history of HIV who is compliant with HAART therapy presents with nausea and vomiting this morning with some lightheadedness. He drove himself to the emergency department to be evaluated. He is in no acute distress on arrival, has no abnormal exam findings, and feels that he may be mildly dehydrated. Provided Zofran for symptoms, recommended follow-up with primary care physician as needed, no lab workup is indicated for acute onset symptoms that appear to be resolving. He has follow-up coming with his infectious disease specialist and can discuss with them and primary care, return at any time with persistent symptoms.  Lyndal Pulley, MD 11/01/15 [phone]

## 2015-11-01 NOTE — Discharge Instructions (Signed)

## 2015-11-27 ENCOUNTER — Ambulatory Visit (INDEPENDENT_AMBULATORY_CARE_PROVIDER_SITE_OTHER): Payer: BLUE CROSS/BLUE SHIELD | Admitting: Family Medicine

## 2015-11-27 VITALS — BP 114/74 | HR 69 | Temp 98.4°F | Resp 17 | Ht 73.0 in | Wt 204.0 lb

## 2015-11-27 DIAGNOSIS — K6289 Other specified diseases of anus and rectum: Secondary | ICD-10-CM

## 2015-11-27 MED ORDER — HYDROCORTISONE 2.5 % RE CREA: 1.0000 "application " | TOPICAL_CREAM | Freq: Two times a day (BID) | RECTAL | Status: DC

## 2015-11-27 MED ORDER — HYDROCORTISONE ACETATE 25 MG RE SUPP: 25.0000 mg | Freq: Two times a day (BID) | RECTAL | Status: DC

## 2015-11-27 NOTE — Progress Notes (Signed)
[facility] [address] [place] [address] [phone]  Date:  11/27/2015   Name:  Dillon Wilson.   DOB:  [birth year]   MRN:  [id number]  PCP:  Elvina Sidle, MD    Chief Complaint: Hemorrhoids   History of Present Illness:  Dillon Wilson. is a 34 y.o. 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 He is here with complaint of some traces of pink on the TP and some pain in the anal area.  The area feels like it is "burning" to him.  He is not aware of any injury to the area and is otherwise feeling well   He did have chronic diarrhea and underwent a colonoscopy earlier this year that looked ok per his report.  His mother did have colon cancer and died in her 17s.  They will do regular colonoscopy for screening  He is SA with a male partner and they acutally were just married 3 days ago No other GI sx, the diarrhea is now better  Patient Active Problem List   Diagnosis Date Noted  . Anal fissure 02/10/2013  . External hemorrhoids 02/10/2013  . Depressive disorder 02/07/2013  . 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 HIV (human immunodeficiency virus infection) (HCC) 11/03/2012  . Anxiety state 10/02/2012  . Human immunodeficiency virus (HIV) disease (HCC) 11/12/2010    Past Medical History  Diagnosis Date  . HIV disease (HCC)   . GERD (gastroesophageal reflux disease)   . Ulcer     Past Surgical History  Procedure Laterality Date  . Wisdom tooth extraction    . Upper gastrointestinal endoscopy    . Colonoscopy      Social History  Substance Use Topics  . Smoking status: Current Some Day Smoker    Types: Cigars  . Smokeless tobacco: Never Used  . Alcohol Use: 0.0 oz/week    0 Standard drinks or equivalent per week    Family History  Problem Relation Age of Onset  . Colon cancer Mother 10    Died at 11 or 57  . Diabetes Father    . Hypertension Father   . Diabetes Sister   . Hypertension Sister   . Psoriasis Maternal Grandmother   . Hypertension Maternal Grandfather   . Lung cancer Maternal Grandfather   . Diabetes Maternal Grandfather   . Diabetes Paternal Grandmother   . Alzheimer's disease Paternal Grandmother   . Lung cancer Paternal Grandfather     lung  . Colon polyps Neg Hx   . Esophageal cancer Neg Hx   . Kidney disease Neg Hx   . Stomach cancer Neg Hx   . Rectal cancer Neg Hx     Allergies  Allergen Reactions  . Ciprofloxacin Diarrhea and Nausea Only  . Dapsone Other (See Comments)    G6PD deficiency  . Primaquine     Other reaction(s): Other (See Comments) G6PD deficiency    Medication list has been reviewed and updated.  Current Outpatient Prescriptions on File Prior to Visit  Medication Sig Dispense Refill  . emtricitabine-tenofovir (TRUVADA) 200-300 MG per tablet Take 1 tablet by mouth daily. Through a study at [facility]    . escitalopram (LEXAPRO) 20 MG tablet Take 1 tablet (20 mg total) by mouth daily. 30 tablet 5  . LORazepam (ATIVAN) 1 MG tablet Take 0.5 tablets (0.5 mg  total) by mouth at bedtime. 30 tablet 0  . omeprazole (PRILOSEC) 20 MG capsule Take 20 mg by mouth daily.    . STUDY MEDICATION Take 1 tablet by mouth daily. Combination pill: Cobicistat 150 mg, darunavir 800 mg, emtricitabine 200 mg, tenofovir alefenamide 10 mg,  [facility]    . ondansetron (ZOFRAN ODT) 4 MG disintegrating tablet 4mg  ODT q4 hours prn nausea/vomit (Patient not taking: Reported on 11/27/2015) 4 tablet 0  . PREVIDENT 1.1 % GEL dental gel Place 1 application onto teeth daily. Reported on 11/27/2015  0   No current facility-administered medications on file prior to visit.    Review of Systems:  As per HPI- otherwise negative.   Physical Examination: Filed Vitals:   11/27/15 1104  BP: 114/74  Pulse: 69  Temp: 98.4 F (36.9 C)  Resp: 17   Filed Vitals:    11/27/15 1104  Height: 6\' 1"  (1.854 m)  Weight: 204 lb (92.534 kg)   Body mass index is 26.92 kg/(m^2). Ideal Body Weight: Weight in (lb) to have BMI = 25: 189.1  GEN: WDWN, NAD, Non-toxic, A & O x 3, looks well HEENT: Atraumatic, Normocephalic. Neck supple. No masses, No LAD. Ears and Nose: No external deformity. CV: RRR, No M/G/R. No JVD. No thrill. No extra heart sounds. PULM: CTA B, no wheezes, crackles, rhonchi. No retractions. No resp. distress. No accessory muscle use. ABD: S, NT, ND No rebound. No HSM.  Belly is benign  EXTR: No c/c/e NEURO Normal gait.  PSYCH: Normally interactive. Conversant. Not depressed or anxious appearing.  Calm demeanor.  Rectal exam: small warts, OW normal .  No visible hemorrhoid or fissure, slight tenderness with DTR but otherwise exam is normal, no gross bleeding   Assessment and Plan: Anal irritation - Plan: hydrocortisone (ANUSOL-HC) 25 MG suppository, hydrocortisone (ANUSOL-HC) 2.5 % rectal cream  Will treat for anal irritation with topical steroid either cream or suppository. He will keep me posted as to his progress  See patient instructions for more details.     Signed Abbe AmsterdamJessica Copland, MD

## 2015-11-27 NOTE — Patient Instructions (Signed)
Use either the suppository or the cream twice a day- whichever seems to help you more Let me know if you are not feeling better in the next few days- Sooner if worse.   Keep the area very clean- you might try using a damp washcloth or soft papertowel to wipe instead of TP for a few days

## 2015-11-30 ENCOUNTER — Ambulatory Visit (INDEPENDENT_AMBULATORY_CARE_PROVIDER_SITE_OTHER): Payer: BLUE CROSS/BLUE SHIELD | Admitting: Family Medicine

## 2015-11-30 VITALS — BP 118/80 | HR 84 | Temp 98.1°F | Resp 16 | Ht 73.0 in | Wt 204.0 lb

## 2015-11-30 DIAGNOSIS — K649 Unspecified hemorrhoids: Secondary | ICD-10-CM

## 2015-11-30 DIAGNOSIS — A630 Anogenital (venereal) warts: Secondary | ICD-10-CM | POA: Diagnosis not present

## 2015-11-30 MED ORDER — PODOFILOX 0.5 % EX SOLN: CUTANEOUS | Status: DC

## 2015-11-30 NOTE — Patient Instructions (Signed)
Podofilox topical gel What is this medicine? PODOFILOX (po do FIL ox) is used to remove genital or perianal warts. This medicine may be used for other purposes; ask your health care provider or pharmacist if you have questions. What should I tell my health care provider before I take this medicine? They need to know if you have any of these conditions: -an unusual or allergic reaction to podofilox, podophyllum resin, other medicines, foods, dyes or preservatives -pregnant or trying to get pregnant -breast-feeding How should I use this medicine? This medicine is for external use only. Do not take by mouth. This medicine may be used to remove warts on areas of the skin around the vagina or penis and between the rectum and the genitals. It should not be used to treat warts that are inside the rectum, vagina, or penis. Follow the directions on the prescription label. Wash hands before and after use. Apply the gel to the specific wart as instructed by your doctor or health care professional. Use the applicator provided or a cotton-tipped applicator. Applicators should not be re-used. Make sure that the gel is dry before normal, untreated skin comes into contact with the treated skin. This medicine can cause severe irritation of normal skin. If contact with normal skin occurs, immediately flush the area thoroughly with water. Avoid contact with the eyes. If eye contact occurs, immediately flush the eye with large quantities of water for 15 minutes and seek medical attention. Do not use this medicine more often or for longer than directed. Talk to your pediatrician regarding the use of this medicine in children. Special care may be needed. Overdosage: If you think you have taken too much of this medicine contact a poison control center or emergency room at once. NOTE: This medicine is only for you. Do not share this medicine with others. What if I miss a dose? If you miss a dose, use it as soon as you can.  If it is almost time for your next dose, use only that dose. Do not use double or extra doses. What may interact with this medicine? Interactions are not expected. Do not use any other skin products on the same area of skin without asking your doctor or health care professional. This list may not describe all possible interactions. Give your health care provider a list of all the medicines, herbs, non-prescription drugs, or dietary supplements you use. Also tell them if you smoke, drink alcohol, or use illegal drugs. Some items may interact with your medicine. What should I watch for while using this medicine? Visit your doctor or health care professional for regular checks on your progress. This medicine is not a cure. New warts may develop during or after treatment. Tell your doctor or health care professional if your symptoms do not start to get better within one week. The weekly treatment course can be repeated up to 4 times. If the wart does not go away in 4 weeks, a different treatment should be considered. If you are pregnant or think you might be pregnant, contact your doctor or health care professional. Sexual (genital, oral, anal) contact should be avoided while the medication is on the skin. The only way to prevent infecting others with the HPV virus (the virus that causes genital warts) is to avoid direct skin-to-skin contact. If warts are visible in the genital area, sexual contact should be avoided until the warts are treated. Experts advise that using latex condoms during sexual contact may reduce, but not  entirely prevent, infecting others. What side effects may I notice from receiving this medicine? Side effects that you should report to your doctor or health care professional as soon as possible: -allergic reactions like skin rash, itching or hives, swelling of the face, lips, or tongue -bleeding, blistering, burning, crusting, or scabbing of treated skin -blood in the  urine -dizziness -vomiting (may indicate excessive dosage) Side effects that usually do not require medical attention (report to your doctor or health care professional if they continue or are bothersome): -dryness, flaking or peeling of the skin -headache -mild redness, itching or stinging of the skin This list may not describe all possible side effects. Call your doctor for medical advice about side effects. You may report side effects to FDA at 1-800-FDA-1088. Where should I keep my medicine? Keep out of the reach of children. Store at room temperature between 15 and 30 degrees C (59 and 86 degrees F). Do not freeze. Keep container tightly closed. This medicine contains alcohol and is flammable. Do not store near heat or open flame. Throw away any unused medicine after the expiration date. NOTE: This sheet is a summary. It may not cover all possible information. If you have questions about this medicine, talk to your doctor, pharmacist, or health care provider.    2016, Elsevier/[employer] Standard. (2008-06-20 16:12:59)

## 2015-11-30 NOTE — Progress Notes (Signed)
Chief Complaint:  Chief Complaint  Patient presents with  . wart removal    pt thinks its a wart on bottom wants it removed     HPI: Dillon Wilson. is a 34 y.o. male who reports to [facility] today complaining of anal warts. Wants it frozen off. Has had this done before. He has HIV as well. HE was seen here for hemorrods and is imprving with that but he has anal warts and wants to get them removed.   Past Medical History  Diagnosis Date  . HIV disease (HCC)   . GERD (gastroesophageal reflux disease)   . Ulcer    Past Surgical History  Procedure Laterality Date  . Wisdom tooth extraction    . Upper gastrointestinal endoscopy    . Colonoscopy     Social History   Social History  . Marital Status: Single    Spouse Name: N/A  . Number of Children: 0  . Years of Education: N/A   Occupational History  . Health insurance    Social History Main Topics  . Smoking status: Current Some Day Smoker    Types: Cigars  . Smokeless tobacco: Never Used  . Alcohol Use: 0.0 oz/week    0 Standard drinks or equivalent per week  . Drug Use: No  . Sexual Activity: Not Asked   Other Topics Concern  . None   Social History Narrative   Family History  Problem Relation Age of Onset  . Colon cancer Mother 72    Died at 78 or 60  . Diabetes Father   . Hypertension Father   . Diabetes Sister   . Hypertension Sister   . Psoriasis Maternal Grandmother   . Hypertension Maternal Grandfather   . Lung cancer Maternal Grandfather   . Diabetes Maternal Grandfather   . Diabetes Paternal Grandmother   . Alzheimer's disease Paternal Grandmother   . Lung cancer Paternal Grandfather     lung  . Colon polyps Neg Hx   . Esophageal cancer Neg Hx   . Kidney disease Neg Hx   . Stomach cancer Neg Hx   . Rectal cancer Neg Hx    Allergies  Allergen Reactions  . Ciprofloxacin Diarrhea and Nausea Only  . Dapsone Other (See Comments)    G6PD deficiency  . Primaquine     Other  reaction(s): Other (See Comments) G6PD deficiency   Prior to Admission medications   Medication Sig Start Date End Date Taking? Authorizing Provider  escitalopram (LEXAPRO) 20 MG tablet Take 1 tablet (20 mg total) by mouth daily. 10/31/15  Yes Peyton Najjar, MD  hydrocortisone (ANUSOL-HC) 2.5 % rectal cream Place 1 application rectally 2 (two) times daily. 11/27/15  Yes Gwenlyn Found Copland, MD  hydrocortisone (ANUSOL-HC) 25 MG suppository Place 1 suppository (25 mg total) rectally 2 (two) times daily. 11/27/15  Yes Gwenlyn Found Copland, MD  LORazepam (ATIVAN) 1 MG tablet Take 0.5 tablets (0.5 mg total) by mouth at bedtime. 10/31/15  Yes Peyton Najjar, MD  omeprazole (PRILOSEC) 20 MG capsule Take 20 mg by mouth daily.   Yes Historical Provider, MD  STUDY MEDICATION Take 1 tablet by mouth daily. Combination pill: Cobicistat 150 mg, darunavir 800 mg, emtricitabine 200 mg, tenofovir alefenamide 10 mg,  through [facility]   Yes Historical Provider, MD     ROS: The patient denies fevers, chills, night sweats, unintentional weight loss, chest pain, palpitations, wheezing, dyspnea on exertion, nausea, vomiting,  abdominal pain, dysuria, hematuria, melena, numbness, weakness, or tingling.   All other systems have been reviewed and were otherwise negative with the exception of those mentioned in the HPI and as above.    PHYSICAL EXAM: Filed Vitals:   11/30/15 1705  BP: 118/80  Pulse: 84  Temp: 98.1 F (36.7 C)  Resp: 16   Body mass index is 26.92 kg/(m^2).   General: Alert, no acute distress HEENT:  Normocephalic, atraumatic, oropharynx patent. EOMI, PERRLA Cardiovascular:  Regular rate and rhythm, no rubs murmurs or gallops.  No Carotid bruits, radial pulse intact. No pedal edema.  Respiratory: Clear to auscultation bilaterally.  No wheezes, rales, or rhonchi.  No cyanosis, no use of accessory musculature Abdominal: No organomegaly, abdomen is soft and non-tender, positive  bowel sounds. No masses. Skin: No rashes. Neurologic: Facial musculature symmetric. Psychiatric: Patient acts appropriately throughout our interaction. Lymphatic: No cervical or submandibular lymphadenopathy Musculoskeletal: Gait intact. No edema, tenderness No rectal bleeding + 3 lesions, 2 on the left perirectal area and one at larger one >5 mm in size at the 12 oclock position  + hemorrhoids   LABS: Results for orders placed or performed during the hospital encounter of 07/06/15  CBC  Result Value Ref Range   WBC 5.6 4.0 - 10.5 K/uL   RBC 4.54 4.22 - 5.81 MIL/uL   Hemoglobin 12.9 (L) 13.0 - 17.0 g/dL   HCT 16.139.5 09.639.0 - 04.552.0 %   MCV 87.0 78.0 - 100.0 fL   MCH 28.4 26.0 - 34.0 pg   MCHC 32.7 30.0 - 36.0 g/dL   RDW 40.912.5 81.111.5 - 91.415.5 %   Platelets 157 150 - 400 K/uL  Basic metabolic panel  Result Value Ref Range   Sodium 138 135 - 145 mmol/L   Potassium 4.3 3.5 - 5.1 mmol/L   Chloride 103 101 - 111 mmol/L   CO2 28 22 - 32 mmol/L   Glucose, Bld 92 65 - 99 mg/dL   BUN 11 6 - 20 mg/dL   Creatinine, Ser 7.821.24 0.61 - 1.24 mg/dL   Calcium 9.1 8.9 - 95.610.3 mg/dL   GFR calc non Af Amer >60 >60 mL/min   GFR calc Af Amer >60 >60 mL/min   Anion gap 7 5 - 15  Troponin I  Result Value Ref Range   Troponin I <0.03 <0.031 ng/mL     EKG/XRAY:   Primary read interpreted [facility]   ASSESSMENT/PLAN: Encounter Diagnosis  Name Primary?  Marland Kitchen. Anal warts Yes   After VCO, cryotherapy to 3 lesions Patient tolerated procedure well Rx Podifilix for further recurrence Fu prn   Gross sideeffects, risk and benefits, and alternatives of medications d/w patient. Patient is aware that all medications have potential sideeffects and we are unable to predict every sideeffect or drug-drug interaction that may occur.  Katarina Riebe DO  12/02/2015 11:52 AM

## 2015-12-02 ENCOUNTER — Encounter: Payer: Self-pay | Admitting: Family Medicine

## 2016-01-09 ENCOUNTER — Ambulatory Visit (INDEPENDENT_AMBULATORY_CARE_PROVIDER_SITE_OTHER): Payer: BLUE CROSS/BLUE SHIELD | Admitting: Family Medicine

## 2016-01-09 ENCOUNTER — Encounter: Payer: Self-pay | Admitting: *Deleted

## 2016-01-09 VITALS — BP 129/82 | HR 77 | Temp 99.1°F | Resp 16 | Ht 73.0 in | Wt 207.0 lb

## 2016-01-09 DIAGNOSIS — J029 Acute pharyngitis, unspecified: Secondary | ICD-10-CM | POA: Diagnosis not present

## 2016-01-09 DIAGNOSIS — B9789 Other viral agents as the cause of diseases classified elsewhere: Secondary | ICD-10-CM

## 2016-01-09 DIAGNOSIS — J028 Acute pharyngitis due to other specified organisms: Principal | ICD-10-CM

## 2016-01-09 MED ORDER — AZITHROMYCIN 250 MG PO TABS: ORAL_TABLET | ORAL | Status: DC

## 2016-01-09 NOTE — Progress Notes (Signed)
  Dillon Wilson. is a 35 y.o. male presenting with a sore throat for 2-3 days.  Associated symptoms include:  sinus pressure, sore throat.  Symptoms are stable.  Home treatment thus far includes:  rest, hydration and Benadryl.  Did receive influenza shot in Nov 2016.    No known sick contacts with similar symptoms.  There is no history of of similar symptoms.  Exam:  BP 129/82 mmHg  Pulse 77  Temp(Src) 99.1 F (37.3 C)  Resp 16  Ht  (1.854 m)  Wt 207 lb (93.895 kg)  BMI 27.32 kg/m2 Constitutional : NAD HEENT : turbinate edema L side.  MMM/O&P clear.  NO sinus pressure/pain  Neck : No LAD Heart : RRR Lungs : CTAB Skin : No rashes.  Impression/Plan 1) Rhinosinusitis - Most likely viral at this point.  Recommend Sx tx with nasal saline, mucinex 1200 mg two times per day, salt water gargles, and ibuprofen.

## 2016-01-09 NOTE — Patient Instructions (Signed)
  nasal saline/netti pot, mucinex 1200 mg two times per day, salt water gargles, and ibuprofen.

## 2016-08-27 ENCOUNTER — Encounter (HOSPITAL_BASED_OUTPATIENT_CLINIC_OR_DEPARTMENT_OTHER): Payer: Self-pay | Admitting: Emergency Medicine

## 2016-08-27 ENCOUNTER — Emergency Department (HOSPITAL_BASED_OUTPATIENT_CLINIC_OR_DEPARTMENT_OTHER)
Admission: EM | Admit: 2016-08-27 | Discharge: 2016-08-27 | Disposition: A | Discharge status: Home / Self Care | Payer: BLUE CROSS/BLUE SHIELD | Attending: Emergency Medicine | Admitting: Emergency Medicine

## 2016-08-27 ENCOUNTER — Encounter (HOSPITAL_BASED_OUTPATIENT_CLINIC_OR_DEPARTMENT_OTHER): Payer: Self-pay | Admitting: *Deleted

## 2016-08-27 DIAGNOSIS — R197 Diarrhea, unspecified: Secondary | ICD-10-CM | POA: Insufficient documentation

## 2016-08-27 DIAGNOSIS — K644 Residual hemorrhoidal skin tags: Secondary | ICD-10-CM | POA: Insufficient documentation

## 2016-08-27 DIAGNOSIS — F17290 Nicotine dependence, other tobacco product, uncomplicated: Secondary | ICD-10-CM | POA: Insufficient documentation

## 2016-08-27 DIAGNOSIS — Z21 Asymptomatic human immunodeficiency virus [HIV] infection status: Secondary | ICD-10-CM | POA: Insufficient documentation

## 2016-08-27 DIAGNOSIS — R1084 Generalized abdominal pain: Secondary | ICD-10-CM | POA: Diagnosis present

## 2016-08-27 LAB — COMPREHENSIVE METABOLIC PANEL
ALT: 14 U/L — AB (ref 17–63)
AST: 19 U/L (ref 15–41)
Albumin: 4 g/dL (ref 3.5–5.0)
Alkaline Phosphatase: 52 U/L (ref 38–126)
Anion gap: 5 (ref 5–15)
BILIRUBIN TOTAL: 0.7 mg/dL (ref 0.3–1.2)
BUN: 9 mg/dL (ref 6–20)
CO2: 27 mmol/L (ref 22–32)
CREATININE: 1.11 mg/dL (ref 0.61–1.24)
Calcium: 9 mg/dL (ref 8.9–10.3)
Chloride: 106 mmol/L (ref 101–111)
GFR calc non Af Amer: >60 mL/min (ref >60)
Glucose, Bld: 88 mg/dL (ref 65–99)
Potassium: 3.7 mmol/L (ref 3.5–5.1)
SODIUM: 138 mmol/L (ref 135–145)
TOTAL PROTEIN: 7.6 g/dL (ref 6.5–8.1)

## 2016-08-27 LAB — CBC WITH DIFFERENTIAL/PLATELET
BASOS PCT: 0 %
Basophils Absolute: 0 10*3/uL (ref 0.0–0.1)
Eosinophils Absolute: 0.1 10*3/uL (ref 0.0–0.7)
Eosinophils Relative: 1 %
HEMATOCRIT: 37.1 % — AB (ref 39.0–52.0)
HEMOGLOBIN: 12.4 g/dL — AB (ref 13.0–17.0)
Lymphocytes Relative: 27 %
Lymphs Abs: 2 10*3/uL (ref 0.7–4.0)
MCH: 29 pg (ref 26.0–34.0)
MCHC: 33.4 g/dL (ref 30.0–36.0)
MCV: 86.7 fL (ref 78.0–100.0)
MONO ABS: 0.3 10*3/uL (ref 0.1–1.0)
MONOS PCT: 4 %
Neutro Abs: 5.2 10*3/uL (ref 1.7–7.7)
Neutrophils Relative %: 68 %
Platelets: 171 10*3/uL (ref 150–400)
RBC: 4.28 MIL/uL (ref 4.22–5.81)
RDW: 12.5 % (ref 11.5–15.5)
WBC: 7.6 10*3/uL (ref 4.0–10.5)

## 2016-08-27 LAB — URINALYSIS, ROUTINE W REFLEX MICROSCOPIC
BILIRUBIN URINE: NEGATIVE
GLUCOSE, UA: NEGATIVE mg/dL
Hgb urine dipstick: NEGATIVE
Ketones, ur: NEGATIVE mg/dL
Leukocytes, UA: NEGATIVE
Nitrite: NEGATIVE
PH: 6.5 (ref 5.0–8.0)
Protein, ur: NEGATIVE mg/dL
SPECIFIC GRAVITY, URINE: 1.016 (ref 1.005–1.030)

## 2016-08-27 LAB — OCCULT BLOOD X 1 CARD TO LAB, STOOL: FECAL OCCULT BLD: NEGATIVE

## 2016-08-27 LAB — LIPASE, BLOOD: LIPASE: 27 U/L (ref 11–51)

## 2016-08-27 MED ORDER — HYDROCORTISONE 2.5 % RE CREA: TOPICAL_CREAM | RECTAL | 0 refills | Status: DC

## 2016-08-27 MED ORDER — DICYCLOMINE HCL 10 MG PO CAPS
10.0000 mg | ORAL_CAPSULE | Freq: Once | ORAL | Status: AC
  Administered 2016-08-27: 10 mg via ORAL
  Filled 2016-08-27: qty 1

## 2016-08-27 MED ORDER — ACETAMINOPHEN 325 MG PO TABS
650.0000 mg | ORAL_TABLET | Freq: Once | ORAL | Status: AC
  Administered 2016-08-27: 650 mg via ORAL
  Filled 2016-08-27: qty 2

## 2016-08-27 MED ORDER — SODIUM CHLORIDE 0.9 % IV BOLUS (SEPSIS)
1000.0000 mL | Freq: Once | INTRAVENOUS | Status: AC
  Administered 2016-08-27: 1000 mL via INTRAVENOUS

## 2016-08-27 MED FILL — PROCTO-MED HC 2.5% CREAM: 2.5 | 14 days supply | Qty: 30 | Fill #0

## 2016-08-27 NOTE — ED Triage Notes (Signed)
Pt c/o abd pain since last Friday with diarrhea.

## 2016-08-27 NOTE — ED Provider Notes (Signed)
[facility] Provider Note   CSN: [id number] Arrival date & time: 08/27/16  1215     History   Chief Complaint Chief Complaint  Patient presents with  . Abdominal Pain    HPI Dillon GuardianLeo T Mcguirk Jr. is a 35 y.o. male.  Patient is 35 yo M with PMH of HIV (CD4 count 796, no viral load detected at last ID appointment 05/13/2016), anal papilloma, external hemorrhoids, and chronic diarrhea, presenting with chief complaint of generalized abdominal discomfort and diarrhea that has been constant over the past 10 days. The abdominal discomfort is described as "rumbling," with diarrhea often following meals, but no change in appetite, nausea, or vomiting. Also complaining of painful hemorrhoid, with some blood on toilet paper after wiping. Denies recent fever, chills, chest pain, palpitations, shortness of breath, dysuria, genital sores, or discharge. No surgical hx. Last colonoscopy 05/2105 normal.       Past Medical History:  Diagnosis Date  . GERD (gastroesophageal reflux disease)   . HIV disease (HCC)   . Ulcer     Patient Active Problem List   Diagnosis Date Noted  . Anal fissure 02/10/2013  . External hemorrhoids 02/10/2013  . Depressive disorder 02/07/2013  . 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 HIV (human immunodeficiency virus infection) (HCC) 11/03/2012  . Anxiety state 10/02/2012  . Human immunodeficiency virus (HIV) disease (HCC) 11/12/2010    Past Surgical History:  Procedure Laterality Date  . COLONOSCOPY    . UPPER GASTROINTESTINAL ENDOSCOPY    . WISDOM TOOTH EXTRACTION         Home Medications    Prior to Admission medications   Medication Sig Start Date End Date Taking? Authorizing Provider  azithromycin (ZITHROMAX) 250 MG tablet 2 tab PO x 1 day and then 1 tab PO thereafter 01/09/16   Bryan R Hess, DO  LORazepam (ATIVAN) 1 MG tablet Take 0.5 tablets (0.5 mg total) by mouth at bedtime. 10/31/15   Peyton Najjaravid H Hopper, MD  omeprazole (PRILOSEC) 20 MG  capsule Take 20 mg by mouth daily.    Historical Provider, MD  podofilox (CONDYLOX) 0.5 % external solution Apply to palpable external warts twice daily for three days, followed by a four day rest period, and then repeated up to four times. 11/30/15   Thao P Le, DO  STUDY MEDICATION Take 1 tablet by mouth daily. Combination pill: Cobicistat 150 mg, darunavir 800 mg, emtricitabine 200 mg, tenofovir [facility]fenamide 10 mg,  [facility]    Historical Provider, MD    Family History Family History  Problem Relation Age of Onset  . Colon cancer Mother 6052    Died at 1654 or 9055  . Diabetes Father   . Hypertension Father   . Diabetes Sister   . Hypertension Sister   . Psoriasis Maternal Grandmother   . Hypertension Maternal Grandfather   . Lung cancer Maternal Grandfather   . Diabetes Maternal Grandfather   . Diabetes Paternal Grandmother   . Alzheimer's disease Paternal Grandmother   . Lung cancer Paternal Grandfather     lung  . Colon polyps Neg Hx   . Esophageal cancer Neg Hx   . Kidney disease Neg Hx   . Stomach cancer Neg Hx   . Rectal cancer Neg Hx     Social History Social History  Substance Use Topics  . Smoking status: Current Some Day Smoker    Types: Cigars  . Smokeless tobacco: Never Used  . Alcohol use 0.0 oz/week  Allergies   Ciprofloxacin; Dapsone; and Primaquine   Review of Systems Review of Systems  All other systems reviewed and are negative.    Physical Exam Updated Vital Signs BP 128/86   Pulse 71   Temp 98.1 F (36.7 C) (Oral)   Resp 18   Ht 6\' 1"  (1.854 m)   Wt 93.9 kg   SpO2 100%   BMI 27.31 kg/m   Physical Exam  Constitutional: He appears well-developed and well-nourished. No distress.  HENT:  Head: Normocephalic and atraumatic.  Mouth/Throat: Oropharynx is clear and moist.  Eyes: Conjunctivae are normal.  Neck: Neck supple.  Cardiovascular: Normal rate, regular rhythm, normal heart sounds and intact distal  pulses.   Pulmonary/Chest: Effort normal and breath sounds normal. No respiratory distress.  Abdominal: Soft. Bowel sounds are normal. He exhibits no distension. There is generalized tenderness. There is no guarding, no tenderness at McBurney's point and negative Murphy's sign.  Genitourinary: Rectal exam shows guaiac negative stool.  Genitourinary Comments: No gross blood noted on rectal exam. External hemorrhoid, no incarnation or thrombosis  Neurological: He is alert.  Skin: Skin is warm and dry.  Psychiatric: He has a normal mood and affect.  Nursing note and vitals reviewed.    ED Treatments / Results  Labs (all labs ordered are listed, but only abnormal results are displayed) Labs Reviewed  CBC WITH DIFFERENTIAL/PLATELET  COMPREHENSIVE METABOLIC PANEL  LIPASE, BLOOD  URINALYSIS, ROUTINE W REFLEX MICROSCOPIC (NOT AT [facility])    EKG  EKG Interpretation None       Radiology No results found.  Procedures Procedures (including critical care time)  Medications Ordered in ED Medications  sodium chloride 0.9 % bolus 1,000 mL (not administered)     Initial Impression / Assessment and Plan / ED Course  I have reviewed the triage vital signs and the nursing notes.  Pertinent labs & imaging results that were available during my care of the patient were reviewed by me and considered in my medical decision making (see chart for details).  Clinical Course   Patient is 35 yo M with hx of HIV, chronic diarrhea, and external hemorrhoids, presenting with similar complaints. CBC, CMP, lipase, urinalysis, and hemoccult ordered - all unremarkable. External hemorrhoid noted, but otherwise benign abdominal exam. IVF, Bentyl, and Tylenol for pain.  On reassessment, patient's condition and exam remains unchanged. Patient is most concerned with hemorrhoid at this time, but there is no emergent intervention required. Prescription for hydrocortisone cream provided. Also recommended sitz baths  and to f/u with PCP. Clinically stable for d/c home, with instructions to return to ED for worsening abdominal pain, diarrhea, rectal pain, abscess, or fever.   Final Clinical Impressions(s) / ED Diagnoses   Final diagnoses:  None    New Prescriptions New Prescriptions   No medications on file     Jari Pigg II, [place] 08/27/16 1504    Charlynne Pander, MD 08/29/16 [phone]

## 2016-08-28 ENCOUNTER — Emergency Department (HOSPITAL_BASED_OUTPATIENT_CLINIC_OR_DEPARTMENT_OTHER)
Admission: EM | Admit: 2016-08-28 | Discharge: 2016-08-28 | Disposition: A | Discharge status: Home / Self Care | Payer: BLUE CROSS/BLUE SHIELD | Source: Home / Self Care | Attending: Emergency Medicine | Admitting: Emergency Medicine

## 2016-08-28 DIAGNOSIS — K644 Residual hemorrhoidal skin tags: Secondary | ICD-10-CM

## 2016-08-28 MED ORDER — TRAMADOL HCL 50 MG PO TABS: 50.0000 mg | ORAL_TABLET | Freq: Two times a day (BID) | ORAL | 0 refills | Status: DC | PRN

## 2016-08-28 NOTE — ED Provider Notes (Signed)
[facility]   CSN: [id number] Arrival date & time: 08/27/16  2220     History   Chief Complaint Chief Complaint  Patient presents with  . Rectal Pain    HPI Dillon GuardianLeo T Ormiston Jr. is a 35 y.o. male PMH of hemorrhoids, presenting today with worsening rectal pain.  He states he has been compliant with sitz bath, rectal cream, and already has soft BMs.  In fact, Patient states his bowel movements are more like diarrhea which he thinks causes hemorrhoids. He denies any rectal bleeding. He states she's had thrombosed hemorrhoids that require I and D in the past. He has not seen general surgery for this hemorrhoid. There are no further complaints. He is also taking Tylenol and ibuprofen over-the-counter without relief.  10 Systems reviewed and are negative for acute change except as noted in the HPI.    HPI  Past Medical History:  Diagnosis Date  . GERD (gastroesophageal reflux disease)   . HIV disease (HCC)   . Ulcer     Patient Active Problem List   Diagnosis Date Noted  . Anal fissure 02/10/2013  . External hemorrhoids 02/10/2013  . Depressive disorder 02/07/2013  . 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 HIV (human immunodeficiency virus infection) (HCC) 11/03/2012  . Anxiety state 10/02/2012  . Human immunodeficiency virus (HIV) disease (HCC) 11/12/2010    Past Surgical History:  Procedure Laterality Date  . COLONOSCOPY    . UPPER GASTROINTESTINAL ENDOSCOPY    . WISDOM TOOTH EXTRACTION         Home Medications    Prior to Admission medications   Medication Sig Start Date End Date Taking? Authorizing Provider  azithromycin (ZITHROMAX) 250 MG tablet 2 tab PO x 1 day and then 1 tab PO thereafter 01/09/16   Briscoe DeutscherBryan R Hess, DO  hydrocortisone (ANUSOL-HC) 2.5 % rectal cream Apply rectally 2 times daily 08/27/16   Daryl F de Villier II, PA  LORazepam (ATIVAN) 1 MG tablet Take 0.5 tablets (0.5 mg total) by mouth at bedtime. 10/31/15   Peyton Najjaravid H Hopper, MD    omeprazole (PRILOSEC) 20 MG capsule Take 20 mg by mouth daily.    Historical Provider, MD  podofilox (CONDYLOX) 0.5 % external solution Apply to palpable external warts twice daily for three days, followed by a four day rest period, and then repeated up to four times. 11/30/15   Thao P Le, DO  STUDY MEDICATION Take 1 tablet by mouth daily. Combination pill: Cobicistat 150 mg, darunavir 800 mg, emtricitabine 200 mg, tenofovir alefenamide 10 mg,  [facility]    Historical Provider, MD    Family History Family History  Problem Relation Age of Onset  . Colon cancer Mother 6252    Died at 1954 or 2555  . Diabetes Father   . Hypertension Father   . Diabetes Sister   . Hypertension Sister   . Psoriasis Maternal Grandmother   . Hypertension Maternal Grandfather   . Lung cancer Maternal Grandfather   . Diabetes Maternal Grandfather   . Diabetes Paternal Grandmother   . Alzheimer's disease Paternal Grandmother   . Lung cancer Paternal Grandfather     lung  . Colon polyps Neg Hx   . Esophageal cancer Neg Hx   . Kidney disease Neg Hx   . Stomach cancer Neg Hx   . Rectal cancer Neg Hx     Social History Social History  Substance Use Topics  . Smoking status: Current Some  Day Smoker    Types: Cigars  . Smokeless tobacco: Never Used  . Alcohol use 0.0 oz/week     Allergies   Ciprofloxacin; Dapsone; and Primaquine   Review of Systems Review of Systems   Physical Exam Updated Vital Signs BP 131/92 (BP Location: Left Arm)   Pulse 67   Temp 98.2 F (36.8 C) (Oral)   Resp 18   Ht 6\' 1"  (1.854 m)   Wt 207 lb (93.9 kg)   SpO2 100%   BMI 27.31 kg/m   Physical Exam  Constitutional: He is oriented to person, place, and time. Vital signs are normal. He appears well-developed and well-nourished.  Non-toxic appearance. He does not appear ill. No distress.  HENT:  Head: Normocephalic and atraumatic.  Nose: Nose normal.  Mouth/Throat: Oropharynx is clear and  moist. No oropharyngeal exudate.  Eyes: Conjunctivae and EOM are normal. Pupils are equal, round, and reactive to light. No scleral icterus.  Neck: Normal range of motion. Neck supple. No tracheal deviation, no edema, no erythema and normal range of motion present. No thyroid mass and no thyromegaly present.  Cardiovascular: Normal rate, regular rhythm, S1 normal, S2 normal, normal heart sounds, intact distal pulses and normal pulses.  Exam reveals no gallop and no friction rub.   No murmur heard. Pulmonary/Chest: Effort normal and breath sounds normal. No respiratory distress. He has no wheezes. He has no rhonchi. He has no rales.  Abdominal: Soft. Normal appearance and bowel sounds are normal. He exhibits no distension, no ascites and no mass. There is no hepatosplenomegaly. There is no tenderness. There is no rebound, no guarding and no CVA tenderness.  Genitourinary:  Genitourinary Comments: Small 3cm hemorrhoids seen, 3oclock position.  No evidence of thrombosis.  Musculoskeletal: Normal range of motion. He exhibits no edema or tenderness.  Lymphadenopathy:    He has no cervical adenopathy.  Neurological: He is alert and oriented to person, place, and time. He has normal strength. No cranial nerve deficit or sensory deficit.  Skin: Skin is warm, dry and intact. No petechiae and no rash noted. He is not diaphoretic. No erythema. No pallor.  Nursing note and vitals reviewed.    ED Treatments / Results  Labs (all labs ordered are listed, but only abnormal results are displayed) Labs Reviewed - No data to display  EKG  EKG Interpretation None       Radiology No results found.  Procedures Procedures (including critical care time)  Medications Ordered in ED Medications - No data to display   Initial Impression / Assessment and Plan / ED Course  I have reviewed the triage vital signs and the nursing notes.  Pertinent labs & imaging results that were available during my care  of the patient were reviewed by me and considered in my medical decision making (see chart for details).  Clinical Course    patient presents emergency department for worsening hemorrhoid pain. He appears to be compliant with the traditional treatments. We'll give him 8 tablets of tramadol to take for severe pain. Also general surgical follow-up. He appears well and in no acute distress.  VS remain within his normal limits and he is safe for DC.  No indication for I&D because the hemorrhoid is not thrombosed.  Final Clinical Impressions(s) / ED Diagnoses   Final diagnoses:  None    New Prescriptions New Prescriptions   No medications on file     Tomasita Crumble, MD 08/28/16 [phone]

## 2017-02-16 DIAGNOSIS — K219 Gastro-esophageal reflux disease without esophagitis: Secondary | ICD-10-CM | POA: Insufficient documentation

## 2017-02-16 DIAGNOSIS — K6289 Other specified diseases of anus and rectum: Secondary | ICD-10-CM | POA: Insufficient documentation

## 2017-06-24 ENCOUNTER — Ambulatory Visit (INDEPENDENT_AMBULATORY_CARE_PROVIDER_SITE_OTHER): Payer: BLUE CROSS/BLUE SHIELD | Admitting: Podiatry

## 2017-06-24 ENCOUNTER — Encounter: Payer: Self-pay | Admitting: Podiatry

## 2017-06-24 DIAGNOSIS — R61 Generalized hyperhidrosis: Secondary | ICD-10-CM

## 2017-06-24 DIAGNOSIS — L74519 Primary focal hyperhidrosis, unspecified: Secondary | ICD-10-CM

## 2017-06-24 DIAGNOSIS — B353 Tinea pedis: Secondary | ICD-10-CM

## 2017-06-24 MED ORDER — ALUMINUM CHLORIDE 20 % EX SOLN: Freq: Every day | CUTANEOUS | 0 refills | Status: DC

## 2017-06-24 MED ORDER — CLOTRIMAZOLE-BETAMETHASONE 1-0.05 % EX CREA: 1.0000 "application " | TOPICAL_CREAM | Freq: Two times a day (BID) | CUTANEOUS | 0 refills | Status: DC

## 2017-06-24 NOTE — Progress Notes (Signed)
   Subjective:    Patient ID: Dillon GuardianLeo T Madia Jr., male    DOB: 05/21/1981, 36 y.o.   MRN: [id number]  HPI  36 year old male presents the office today for concerns of itching and cracking to his left foot. He states that this started after he was at a pool. He states he gets this itching type pain and feels that the numbness inside of his skin and uses on both sides this started about 2 weeks ago after going to the pool. He states on both feet in the same areas. Denies any recent injury or trauma denies any swelling or redness. He states his feet also sweat quite a bit. He has no other concerns.   Review of Systems  All other systems reviewed and are negative.      Objective:   Physical Exam General: AAO x3, NAD  Dermatological: On the fourth interspace of the left foot there appears to be dry, cracking skin but erythematous base consistent with tinea pedis. I am unable to identify any other rashes to both of his feet he states that both feet subjective to its this all started the same time. There does appear to be evidence of hyperhidrosis. No open lesions identified otherwise.  Vascular: Dorsalis Pedis artery and Posterior Tibial artery pedal pulses are 2/4 bilateral with immedate capillary fill time.  There is no pain with calf compression, swelling, warmth, erythema.   Neruologic: Grossly intact via light touch bilateral. Vibratory intact via tuning fork bilateral.  Patellar and Achilles deep tendon reflexes 2+ bilateral. No Babinski or clonus noted bilateral.   Musculoskeletal: Flatfoot is present bilaterally. No pain, crepitus, or limitation noted with foot and ankle range of motion bilateral. Muscular strength 5/5 in all groups tested bilateral.  Gait: Unassisted, Nonantalgic.      Assessment & Plan:  36 year old male with tinea pedis left foot, hyperhidrosis -Treatment options discussed including all alternatives, risks, and complications -Prescribed Lotrisone cream for tinea  pedis -Drysol to help with the hyperhidrosis. Also discussed shoe gear medications, sock changes as well as diluted vinegar soaks. -Has symptoms of itching, burning type pain to both of his feet and symmetrical bilaterally. Although this is likely resulting from having fungus possibly I do not see any overwhelming evidence of fungus other areas of the foot. Discussed possible neuropathy even although I don't think this is worse going on but we'll continue to monitor. -RTC in 4 weeks or sooner if needed.  Jillyn LedgerMathew Wagoner, DPM

## 2017-07-22 ENCOUNTER — Ambulatory Visit: Payer: BLUE CROSS/BLUE SHIELD | Admitting: Podiatry

## 2017-07-29 ENCOUNTER — Ambulatory Visit: Payer: BLUE CROSS/BLUE SHIELD | Admitting: Podiatry

## 2017-10-03 ENCOUNTER — Encounter (HOSPITAL_BASED_OUTPATIENT_CLINIC_OR_DEPARTMENT_OTHER): Payer: Self-pay | Admitting: Emergency Medicine

## 2017-10-03 ENCOUNTER — Emergency Department (HOSPITAL_BASED_OUTPATIENT_CLINIC_OR_DEPARTMENT_OTHER)
Admission: EM | Admit: 2017-10-03 | Discharge: 2017-10-03 | Disposition: A | Discharge status: Home / Self Care | Payer: BLUE CROSS/BLUE SHIELD | Attending: Emergency Medicine | Admitting: Emergency Medicine

## 2017-10-03 ENCOUNTER — Emergency Department (HOSPITAL_BASED_OUTPATIENT_CLINIC_OR_DEPARTMENT_OTHER): Payer: BLUE CROSS/BLUE SHIELD

## 2017-10-03 DIAGNOSIS — R0982 Postnasal drip: Secondary | ICD-10-CM | POA: Insufficient documentation

## 2017-10-03 DIAGNOSIS — R0981 Nasal congestion: Secondary | ICD-10-CM | POA: Diagnosis present

## 2017-10-03 DIAGNOSIS — R110 Nausea: Secondary | ICD-10-CM | POA: Diagnosis not present

## 2017-10-03 DIAGNOSIS — R070 Pain in throat: Secondary | ICD-10-CM | POA: Diagnosis not present

## 2017-10-03 DIAGNOSIS — M791 Myalgia, unspecified site: Secondary | ICD-10-CM | POA: Diagnosis not present

## 2017-10-03 DIAGNOSIS — R509 Fever, unspecified: Secondary | ICD-10-CM | POA: Insufficient documentation

## 2017-10-03 DIAGNOSIS — Z79899 Other long term (current) drug therapy: Secondary | ICD-10-CM | POA: Insufficient documentation

## 2017-10-03 DIAGNOSIS — F17290 Nicotine dependence, other tobacco product, uncomplicated: Secondary | ICD-10-CM | POA: Diagnosis not present

## 2017-10-03 DIAGNOSIS — B20 Human immunodeficiency virus [HIV] disease: Secondary | ICD-10-CM | POA: Insufficient documentation

## 2017-10-03 DIAGNOSIS — R6889 Other general symptoms and signs: Secondary | ICD-10-CM | POA: Diagnosis not present

## 2017-10-03 LAB — RAPID STREP SCREEN (MED CTR MEBANE ONLY): STREPTOCOCCUS, GROUP A SCREEN (DIRECT): NEGATIVE

## 2017-10-03 LAB — INFLUENZA PANEL BY PCR (TYPE A & B)
Influenza A By PCR: POSITIVE — AB
Influenza B By PCR: NEGATIVE

## 2017-10-03 MED ORDER — ONDANSETRON HCL 4 MG/2ML IJ SOLN
4.0000 mg | Freq: Once | INTRAMUSCULAR | Status: AC
  Administered 2017-10-03: 4 mg via INTRAVENOUS
  Filled 2017-10-03: qty 2

## 2017-10-03 MED ORDER — ACETAMINOPHEN 325 MG PO TABS
650.0000 mg | ORAL_TABLET | Freq: Once | ORAL | Status: AC
  Administered 2017-10-03: 650 mg via ORAL
  Filled 2017-10-03: qty 2

## 2017-10-03 MED ORDER — SODIUM CHLORIDE 0.9 % IV BOLUS (SEPSIS)
1000.0000 mL | Freq: Once | INTRAVENOUS | Status: AC
  Administered 2017-10-03: 1000 mL via INTRAVENOUS

## 2017-10-03 MED ORDER — OSELTAMIVIR PHOSPHATE 75 MG PO CAPS: 75.0000 mg | ORAL_CAPSULE | Freq: Two times a day (BID) | ORAL | 0 refills | Status: DC

## 2017-10-03 MED ORDER — ALBUTEROL SULFATE HFA 108 (90 BASE) MCG/ACT IN AERS
2.0000 | INHALATION_SPRAY | RESPIRATORY_TRACT | Status: DC | PRN
  Administered 2017-10-03: 2 via RESPIRATORY_TRACT
  Filled 2017-10-03: qty 6.7

## 2017-10-03 MED ORDER — KETOROLAC TROMETHAMINE 30 MG/ML IJ SOLN
15.0000 mg | Freq: Once | INTRAMUSCULAR | Status: AC
  Administered 2017-10-03: 15 mg via INTRAVENOUS
  Filled 2017-10-03: qty 1

## 2017-10-03 NOTE — Discharge Instructions (Signed)
You have a flulike illness.  The flu test has been sent and you could follow it online to see if you need to fill the Tamiflu prescription.  Use Motrin and Tylenol to help with your symptoms.  Try and keep yourself hydrated.

## 2017-10-03 NOTE — ED Provider Notes (Signed)
[facility] EMERGENCY DEPARTMENT Provider Note   CSN: [id number] Arrival date & time: 10/03/17  4098     History   Chief Complaint Chief Complaint  Patient presents with  . Generalized Body Aches    HPI Dillon Torre. is a 36 y.o. male.  HPI Patient presents with nasal congestion sore throat myalgias and nausea.  Began around 2 days ago.  Got seen at urgent care yesterday and started on Augmentin for sinus infection.  Now feels worse.  States she had a friend that had some similar symptoms.  Has a history of HIV but reportedly viral load is undetected and has good CD4 count.  Around a week and a half ago had positive neurovirus test but now his diarrhea stopped around a week ago.  No real production with cough.  Does have nasal drainage.  States he just feels bad all over.  Has fever of 102.2 upon arrival. Past Medical History:  Diagnosis Date  . GERD (gastroesophageal reflux disease)   . HIV disease (HCC)   . Ulcer     Patient Active Problem List   Diagnosis Date Noted  . Anal fissure 02/10/2013  . External hemorrhoids 02/10/2013  . Depressive disorder 02/07/2013  . 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 HIV (human immunodeficiency virus infection) (HCC) 11/03/2012  . Anxiety state 10/02/2012  . Human immunodeficiency virus (HIV) disease (HCC) 11/12/2010    Past Surgical History:  Procedure Laterality Date  . COLONOSCOPY    . UPPER GASTROINTESTINAL ENDOSCOPY    . WISDOM TOOTH EXTRACTION         Home Medications    Prior to Admission medications   Medication Sig Start Date End Date Taking? Authorizing Provider  amoxicillin (AMOXIL) 875 MG tablet Take 875 mg by mouth 2 (two) times daily.   Yes [provider]  aluminum chloride (DRYSOL) 20 % external solution Apply topically daily. 06/24/17   Vivi Barrack, DPM  azithromycin (ZITHROMAX) 250 MG tablet 2 tab PO x 1 day and then 1 tab PO thereafter Patient not taking: Reported on 06/24/2017  01/09/16   Briscoe Deutscher, DO  clotrimazole-betamethasone (LOTRISONE) cream Apply 1 application topically 2 (two) times daily. 06/24/17   Vivi Barrack, DPM  hydrocortisone (ANUSOL-HC) 2.5 % rectal cream Apply rectally 2 times daily 08/27/16   de Villier, Daryl F II, PA  LORazepam (ATIVAN) 1 MG tablet Take 0.5 tablets (0.5 mg total) by mouth at bedtime. Patient not taking: Reported on 06/24/2017 10/31/15   Peyton Najjar, MD  omeprazole (PRILOSEC) 20 MG capsule Take 20 mg by mouth daily.    [provider]  oseltamivir (TAMIFLU) 75 MG capsule Take 1 capsule (75 mg total) by mouth every 12 (twelve) hours. 10/03/17   Benjiman Core, MD  podofilox (CONDYLOX) 0.5 % external solution Apply to palpable external warts twice daily for three days, followed by a four day rest period, and then repeated up to four times. Patient not taking: Reported on 06/24/2017 11/30/15   Hamilton Capri P, DO  STUDY MEDICATION Take 1 tablet by mouth daily. Combination pill: Cobicistat 150 mg, darunavir 800 mg, emtricitabine 200 mg, tenofovir alefenamide 10 mg,  through [facility]    [provider]  traMADol (ULTRAM) 50 MG tablet Take 1 tablet (50 mg total) by mouth every 12 (twelve) hours as needed for severe pain. Patient not taking: Reported on 06/24/2017 08/28/16   Tomasita Crumble, MD    Family History Family History  Problem Relation Age of Onset  . Colon cancer Mother 38       Died at 26 or 43  . Diabetes Father   . Hypertension Father   . Diabetes Sister   . Hypertension Sister   . Psoriasis Maternal Grandmother   . Hypertension Maternal Grandfather   . Lung cancer Maternal Grandfather   . Diabetes Maternal Grandfather   . Diabetes Paternal Grandmother   . Alzheimer's disease Paternal Grandmother   . Lung cancer Paternal Grandfather        lung  . Colon polyps Neg Hx   . Esophageal cancer Neg Hx   . Kidney disease Neg Hx   . Stomach cancer Neg Hx   . Rectal cancer Neg Hx      Social History Social History  Substance Use Topics  . Smoking status: Current Some Day Smoker    Types: Cigars  . Smokeless tobacco: Never Used  . Alcohol use 0.0 oz/week     Allergies   Ciprofloxacin; Dapsone; and Primaquine   Review of Systems Review of Systems  Constitutional: Positive for fever. Negative for appetite change.  HENT: Positive for congestion and sore throat.   Respiratory: Positive for cough.   Cardiovascular: Negative for chest pain.  Gastrointestinal: Positive for nausea. Negative for abdominal pain.  Genitourinary: Negative for flank pain.  Musculoskeletal: Positive for myalgias.  Neurological: Negative for syncope.  Hematological: Negative for adenopathy.  Psychiatric/Behavioral: Negative for confusion.     Physical Exam Updated Vital Signs BP 121/74   Pulse 90   Temp 100.1 F (37.8 C)   Resp (!) 26   Ht 6\' 1"  (1.854 m)   Wt 98.9 kg (218 lb)   SpO2 93%   BMI 28.76 kg/m   Physical Exam  Constitutional: He appears well-developed.  HENT:  Mouth/Throat: No oropharyngeal exudate.  Posterior pharyngeal erythema without exudate.  Bilateral TMs without effusion  Neck: Neck supple.  Cardiovascular: Normal rate.   Pulmonary/Chest: No respiratory distress. He has no wheezes. He has no rales.  Abdominal: Soft. There is no tenderness.  Musculoskeletal: He exhibits no edema.  Neurological: He is alert.  Skin: Skin is warm. Capillary refill takes less than 2 seconds.  Psychiatric: He has a normal mood and affect.     ED Treatments / Results  Labs (all labs ordered are listed, but only abnormal results are displayed) Labs Reviewed  RAPID STREP SCREEN (NOT AT [facility])  CULTURE, GROUP A STREP [facility])  INFLUENZA PANEL BY PCR (TYPE A & B)    EKG  EKG Interpretation None       Radiology Dg Chest 2 View  Result Date: 10/03/2017 CLINICAL DATA:  Scratchy throat, body aches, headache, chills and shortness of breath since 10/01/2017. EXAM:  CHEST  2 VIEW COMPARISON:  PA and lateral chest 07/04/2010. FINDINGS: Lungs are clear. Heart size is normal. No pneumothorax or pleural fluid. No bony abnormality. IMPRESSION: Normal chest. Electronically Signed   By: Drusilla Kanner M.D.   On: 10/03/2017 10:07    Procedures Procedures (including critical care time)  Medications Ordered in ED Medications  albuterol (PROVENTIL HFA;VENTOLIN HFA) 108 (90 Base) MCG/ACT inhaler 2 puff (not administered)  sodium chloride 0.9 % bolus 1,000 mL (0 mLs Intravenous Stopped 10/03/17 0947)  ondansetron (ZOFRAN) injection 4 mg (4 mg Intravenous Given 10/03/17 0906)  ketorolac (TORADOL) 30 MG/ML injection 15 mg (15 mg Intravenous Given 10/03/17 0905)  acetaminophen (TYLENOL) tablet 650 mg (650 mg Oral Given 10/03/17 0916)  Initial Impression / Assessment and Plan / ED Course  I have reviewed the triage vital signs and the nursing notes.  Pertinent labs & imaging results that were available during my care of the patient were reviewed by me and considered in my medical decision making (see chart for details).     Patient with flulike symptoms.  Sore throat.  Had mild hypoxia while sleeping.  X-ray reassuring.  Lungs are clear.  But with a mild hypoxia will give inhaler for home.  Flu test sent but will not come back within a reasonable time.  Patient will follow at home and if it is positive he will fill the prescription for the Tamiflu.  Was started on Augmentin yesterday.  I discussed the patient how off the examination from today I would not have started him on the antibiotics.  He is going to decide whether he would like to keep it or not.  If the flu test is positive he will stop it otherwise she will make the decision also.  Overall rather well-appearing and will discharge home.  Final Clinical Impressions(s) / ED Diagnoses   Final diagnoses:  Flu-like symptoms    New Prescriptions New Prescriptions   OSELTAMIVIR (TAMIFLU) 75 MG CAPSULE    Take  1 capsule (75 mg total) by mouth every 12 (twelve) hours.     Benjiman CorePickering, Brissa Asante, MD 10/03/17 1027

## 2017-10-03 NOTE — ED Triage Notes (Signed)
Patient reports he began feeling bad on Wednesday.  Reports "scratchy throat", body aches, headache, chills, nausea, shortness of breath.

## 2017-10-03 NOTE — ED Notes (Signed)
Patient back from x-ray 

## 2017-10-05 LAB — CULTURE, GROUP A STREP (THRC)

## 2017-11-13 DIAGNOSIS — L74513 Primary focal hyperhidrosis, soles: Secondary | ICD-10-CM | POA: Insufficient documentation

## 2018-03-05 ENCOUNTER — Emergency Department (HOSPITAL_BASED_OUTPATIENT_CLINIC_OR_DEPARTMENT_OTHER)
Admission: EM | Admit: 2018-03-05 | Discharge: 2018-03-05 | Disposition: A | Discharge status: Home / Self Care | Payer: BLUE CROSS/BLUE SHIELD | Attending: Physician Assistant | Admitting: Physician Assistant

## 2018-03-05 ENCOUNTER — Other Ambulatory Visit: Payer: Self-pay

## 2018-03-05 ENCOUNTER — Encounter (HOSPITAL_BASED_OUTPATIENT_CLINIC_OR_DEPARTMENT_OTHER): Payer: Self-pay | Admitting: Emergency Medicine

## 2018-03-05 DIAGNOSIS — F17290 Nicotine dependence, other tobacco product, uncomplicated: Secondary | ICD-10-CM | POA: Diagnosis not present

## 2018-03-05 DIAGNOSIS — Z79899 Other long term (current) drug therapy: Secondary | ICD-10-CM | POA: Diagnosis not present

## 2018-03-05 DIAGNOSIS — Z711 Person with feared health complaint in whom no diagnosis is made: Secondary | ICD-10-CM | POA: Diagnosis present

## 2018-03-05 NOTE — Discharge Instructions (Addendum)
May take 3-5 days for this to come back.  Please follow-up with your primary infectious disease physician.  Please return if you have any increase in symptoms.

## 2018-03-05 NOTE — ED Provider Notes (Signed)
[facility] EMERGENCY DEPARTMENT Provider Note   CSN: [id number] Arrival date & time: 03/05/18  0803     History   Chief Complaint Chief Complaint  Patient presents with  . Exposure to STD    HPI Dillon Tennell. is a 37 y.o. male.  HPI   Is a 36 year old male HIV positive.  Patient has well-controlled HIV.  Patient is here with his partner.  His partner had a tiny amount of discharge a couple days ago. Patietn is a bottom.  His partner was worried that patient was cheating on him.  Patient here to prove that he does not have any STDs.  Patient has no symptoms.  RPR, and to do urethral and rectal swab. .  Past Medical History:  Diagnosis Date  . GERD (gastroesophageal reflux disease)   . HIV disease (HCC)   . Ulcer     Patient Active Problem List   Diagnosis Date Noted  . Anal fissure 02/10/2013  . External hemorrhoids 02/10/2013  . Depressive disorder 02/07/2013  . 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 HIV (human immunodeficiency virus infection) (HCC) 11/03/2012  . Anxiety state 10/02/2012  . Human immunodeficiency virus (HIV) disease (HCC) 11/12/2010    Past Surgical History:  Procedure Laterality Date  . COLONOSCOPY    . UPPER GASTROINTESTINAL ENDOSCOPY    . WISDOM TOOTH EXTRACTION          Home Medications    Prior to Admission medications   Medication Sig Start Date End Date Taking? Authorizing Provider  Darunavir-Cobicisctat-Emtricitabine-Tenofovir Alafenamide (SYMTUZA) 800-150-200-10 MG TABS Take 1 tablet by mouth daily with breakfast.   Yes [provider]  aluminum chloride (DRYSOL) 20 % external solution Apply topically daily. 06/24/17   Vivi Barrack, DPM  amoxicillin (AMOXIL) 875 MG tablet Take 875 mg by mouth 2 (two) times daily.    [provider]  azithromycin (ZITHROMAX) 250 MG tablet 2 tab PO x 1 day and then 1 tab PO thereafter Patient not taking: Reported on 06/24/2017 01/09/16   Briscoe Deutscher, DO    clotrimazole-betamethasone (LOTRISONE) cream Apply 1 application topically 2 (two) times daily. 06/24/17   Vivi Barrack, DPM  hydrocortisone (ANUSOL-HC) 2.5 % rectal cream Apply rectally 2 times daily 08/27/16   de Villier, Daryl F II, PA  LORazepam (ATIVAN) 1 MG tablet Take 0.5 tablets (0.5 mg total) by mouth at bedtime. Patient not taking: Reported on 06/24/2017 10/31/15   Peyton Najjar, MD  omeprazole (PRILOSEC) 20 MG capsule Take 20 mg by mouth daily.    [provider]  oseltamivir (TAMIFLU) 75 MG capsule Take 1 capsule (75 mg total) by mouth every 12 (twelve) hours. 10/03/17   Benjiman Core, MD  podofilox (CONDYLOX) 0.5 % external solution Apply to palpable external warts twice daily for three days, followed by a four day rest period, and then repeated up to four times. Patient not taking: Reported on 06/24/2017 11/30/15   Hamilton Capri P, DO  STUDY MEDICATION Take 1 tablet by mouth daily. Combination pill: Cobicistat 150 mg, darunavir 800 mg, emtricitabine 200 mg, tenofovir alefenamide 10 mg,  through [facility]    [provider]  traMADol (ULTRAM) 50 MG tablet Take 1 tablet (50 mg total) by mouth every 12 (twelve) hours as needed for severe pain. Patient not taking: Reported on 06/24/2017 08/28/16   Tomasita Crumble, MD    Family History Family History  Problem Relation Age of Onset  . Colon cancer  Mother 76       Died at 8 or 71  . Diabetes Father   . Hypertension Father   . Diabetes Sister   . Hypertension Sister   . Psoriasis Maternal Grandmother   . Hypertension Maternal Grandfather   . Lung cancer Maternal Grandfather   . Diabetes Maternal Grandfather   . Diabetes Paternal Grandmother   . Alzheimer's disease Paternal Grandmother   . Lung cancer Paternal Grandfather        lung  . Colon polyps Neg Hx   . Esophageal cancer Neg Hx   . Kidney disease Neg Hx   . Stomach cancer Neg Hx   . Rectal cancer Neg Hx     Social History Social  History   Tobacco Use  . Smoking status: Current Some Day Smoker    Types: Cigars  . Smokeless tobacco: Never Used  Substance Use Topics  . Alcohol use: Yes    Alcohol/week: 0.0 oz    Comment: occ  . Drug use: No     Allergies   Ciprofloxacin; Dapsone; and Primaquine   Review of Systems Review of Systems  Constitutional: Negative for activity change.  Respiratory: Negative for shortness of breath.   Cardiovascular: Negative for chest pain.  Gastrointestinal: Negative for abdominal pain.  Genitourinary: Negative for difficulty urinating, discharge, dysuria, penile pain, penile swelling and scrotal swelling.  All other systems reviewed and are negative.    Physical Exam Updated Vital Signs BP (!) 154/84 (BP Location: Right Arm)   Pulse 88   Temp 98.4 F (36.9 C) (Oral)   Resp 18   Ht 6\' 1"  (1.854 m)   Wt 97.5 kg (215 lb)   SpO2 100%   BMI 28.37 kg/m   Physical Exam  Constitutional: He is oriented to person, place, and time. He appears well-nourished.  HENT:  Head: Normocephalic.  Eyes: Conjunctivae are normal.  Cardiovascular: Normal rate.  Pulmonary/Chest: Effort normal.  Genitourinary: Penis normal. No penile tenderness.  Genitourinary Comments: Rectum appears normal  Neurological: He is oriented to person, place, and time.  Skin: Skin is warm and dry. He is not diaphoretic.  Psychiatric: He has a normal mood and affect. His behavior is normal.     ED Treatments / Results  Labs (all labs ordered are listed, but only abnormal results are displayed) Labs Reviewed  RPR  GC/CHLAMYDIA PROBE AMP ([place]) NOT AT [facility]  GC/CHLAMYDIA PROBE AMP () NOT AT [facility]    EKG None  Radiology No results found.  Procedures Procedures (including critical care time)  Medications Ordered in ED Medications - No data to display   Initial Impression / Assessment and Plan / ED Course  I have reviewed the triage vital signs and the nursing  notes.  Pertinent labs & imaging results that were available during my care of the patient were reviewed by me and considered in my medical decision making (see chart for details).      Is a 37 year old male HIV positive.  Patient has well-controlled HIV.  Patient is here with his partner.  His partner had a tiny amount of discharge a couple days ago. Patietn is a bottom.  His partner was worried that patient was cheating on him.  Patient here to prove that he does not have any STDs.  Patient has no symptoms.  RPR, and to do urethral and rectal swab. Marland Kitchen  8:55 AM Given he has no symptoms.  We will not treat at this time.  Final Clinical Impressions(s) / ED Diagnoses   Final diagnoses:  None    ED Discharge Orders    None       Abelino DerrickMackuen, Hendrik Donath Lyn, MD 03/05/18 [phone]

## 2018-03-05 NOTE — ED Triage Notes (Signed)
Pt's spouse reported penile discharge 3 days ago.  Pt would like to be checked for STDs.

## 2018-03-06 LAB — GC/CHLAMYDIA PROBE AMP (~~LOC~~) NOT AT ARMC
Chlamydia: NEGATIVE
Neisseria Gonorrhea: NEGATIVE

## 2018-03-06 LAB — RPR: RPR: NONREACTIVE

## 2018-03-09 LAB — GC/CHLAMYDIA PROBE AMP (~~LOC~~) NOT AT ARMC
Chlamydia: POSITIVE — AB
Neisseria Gonorrhea: NEGATIVE

## 2018-08-29 ENCOUNTER — Emergency Department (HOSPITAL_BASED_OUTPATIENT_CLINIC_OR_DEPARTMENT_OTHER)
Admission: EM | Admit: 2018-08-29 | Discharge: 2018-08-29 | Disposition: A | Discharge status: Home / Self Care | Payer: BLUE CROSS/BLUE SHIELD | Attending: Emergency Medicine | Admitting: Emergency Medicine

## 2018-08-29 ENCOUNTER — Encounter (HOSPITAL_BASED_OUTPATIENT_CLINIC_OR_DEPARTMENT_OTHER): Payer: Self-pay | Admitting: Emergency Medicine

## 2018-08-29 ENCOUNTER — Emergency Department (HOSPITAL_BASED_OUTPATIENT_CLINIC_OR_DEPARTMENT_OTHER): Payer: BLUE CROSS/BLUE SHIELD

## 2018-08-29 ENCOUNTER — Other Ambulatory Visit: Payer: Self-pay

## 2018-08-29 DIAGNOSIS — M5441 Lumbago with sciatica, right side: Secondary | ICD-10-CM

## 2018-08-29 DIAGNOSIS — F17290 Nicotine dependence, other tobacco product, uncomplicated: Secondary | ICD-10-CM | POA: Diagnosis not present

## 2018-08-29 DIAGNOSIS — Z79899 Other long term (current) drug therapy: Secondary | ICD-10-CM | POA: Diagnosis not present

## 2018-08-29 DIAGNOSIS — R1031 Right lower quadrant pain: Secondary | ICD-10-CM | POA: Diagnosis present

## 2018-08-29 LAB — URINALYSIS, ROUTINE W REFLEX MICROSCOPIC
BILIRUBIN URINE: NEGATIVE
GLUCOSE, UA: NEGATIVE mg/dL
Hgb urine dipstick: NEGATIVE
KETONES UR: NEGATIVE mg/dL
Leukocytes, UA: NEGATIVE
Nitrite: NEGATIVE
PH: 6.5 (ref 5.0–8.0)
Protein, ur: NEGATIVE mg/dL
Specific Gravity, Urine: 1.025 (ref 1.005–1.030)

## 2018-08-29 MED ORDER — METHOCARBAMOL 500 MG PO TABS: 500.0000 mg | ORAL_TABLET | Freq: Two times a day (BID) | ORAL | 0 refills | Status: DC

## 2018-08-29 MED ORDER — KETOROLAC TROMETHAMINE 15 MG/ML IJ SOLN
15.0000 mg | Freq: Once | INTRAMUSCULAR | Status: AC
Start: 2018-08-29 — End: 2018-08-29
  Administered 2018-08-29: 15 mg via INTRAMUSCULAR
  Filled 2018-08-29: qty 1

## 2018-08-29 NOTE — ED Provider Notes (Addendum)
[facility] EMERGENCY DEPARTMENT Provider Note   CSN: [id number] Arrival date & time: 08/29/18  1647     History   Chief Complaint Chief Complaint  Patient presents with  . Back Pain    HPI Dillon Wilson. is a 37 y.o. male presenting for right-sided lower back pain.  Patient states that pain began 1 week ago, describes pain as a throbbing right-sided lower back pain that shoots down his right leg.  Patient states that he was seen at an urgent care 6 days ago, had a urinalysis and physical exam performed.  Patient states that he was prescribed anti-inflammatories and muscle relaxer however he did not fill these prescriptions.  Patient states that he is taking pad and that pain subsided for 2 days.  Patient states that pain returned 3 days ago.  States that pain is now moderate in intensity.  Pain is worsened with movement and palpation to his right gluteal area.  Patient states that occasionally pain will radiate across the front of right thigh towards the groin.  At time of evaluation patient denies groin pain.  States that it happened a few times over the past week however it is no pain at this time.  Patient denies saddle area paresthesias, bowel/bladder incontinence, IV drug use, fever or numbness/tingling to the extremities.  Patient states that the pain occasionally radiates towards his groin.  Patient denies penile discharge, testicular pain or swelling.  HPI  Past Medical History:  Diagnosis Date  . GERD (gastroesophageal reflux disease)   . HIV disease (HCC)   . Ulcer     Patient Active Problem List   Diagnosis Date Noted  . Anal fissure 02/10/2013  . External hemorrhoids 02/10/2013  . Depressive disorder 02/07/2013  . 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 HIV (human immunodeficiency virus infection) (HCC) 11/03/2012  . Anxiety state 10/02/2012  . Human immunodeficiency virus (HIV) disease (HCC) 11/12/2010    Past Surgical History:  Procedure Laterality  Date  . COLONOSCOPY    . UPPER GASTROINTESTINAL ENDOSCOPY    . WISDOM TOOTH EXTRACTION          Home Medications    Prior to Admission medications   Medication Sig Start Date End Date Taking? Authorizing Provider  aluminum chloride (DRYSOL) 20 % external solution Apply topically daily. 06/24/17   Vivi Barrack, DPM  amoxicillin (AMOXIL) 875 MG tablet Take 875 mg by mouth 2 (two) times daily.    [provider]  azithromycin (ZITHROMAX) 250 MG tablet 2 tab PO x 1 day and then 1 tab PO thereafter Patient not taking: Reported on 06/24/2017 01/09/16   Briscoe Deutscher, DO  clotrimazole-betamethasone (LOTRISONE) cream Apply 1 application topically 2 (two) times daily. 06/24/17   Vivi Barrack, DPM  Darunavir-Cobicisctat-Emtricitabine-Tenofovir Alafenamide (SYMTUZA) 800-150-200-10 MG TABS Take 1 tablet by mouth daily with breakfast.    [provider]  hydrocortisone (ANUSOL-HC) 2.5 % rectal cream Apply rectally 2 times daily 08/27/16   de Villier, Daryl F II, PA  LORazepam (ATIVAN) 1 MG tablet Take 0.5 tablets (0.5 mg total) by mouth at bedtime. Patient not taking: Reported on 06/24/2017 10/31/15   Peyton Najjar, MD  methocarbamol (ROBAXIN) 500 MG tablet Take 1 tablet (500 mg total) by mouth 2 (two) times daily. 08/29/18   Harlene Salts A, PA-C  omeprazole (PRILOSEC) 20 MG capsule Take 20 mg by mouth daily.    [provider]  oseltamivir (TAMIFLU) 75 MG capsule Take 1 capsule (75  mg total) by mouth every 12 (twelve) hours. 10/03/17   Benjiman Core, MD  podofilox (CONDYLOX) 0.5 % external solution Apply to palpable external warts twice daily for three days, followed by a four day rest period, and then repeated up to four times. Patient not taking: Reported on 06/24/2017 11/30/15   Hamilton Capri P, DO  STUDY MEDICATION Take 1 tablet by mouth daily. Combination pill: Cobicistat 150 mg, darunavir 800 mg, emtricitabine 200 mg, tenofovir alefenamide 10 mg,  through [facility]    [provider]  traMADol (ULTRAM) 50 MG tablet Take 1 tablet (50 mg total) by mouth every 12 (twelve) hours as needed for severe pain. Patient not taking: Reported on 06/24/2017 08/28/16   Tomasita Crumble, MD    Family History Family History  Problem Relation Age of Onset  . Colon cancer Mother 73       Died at 80 or 4  . Diabetes Father   . Hypertension Father   . Diabetes Sister   . Hypertension Sister   . Psoriasis Maternal Grandmother   . Hypertension Maternal Grandfather   . Lung cancer Maternal Grandfather   . Diabetes Maternal Grandfather   . Diabetes Paternal Grandmother   . Alzheimer's disease Paternal Grandmother   . Lung cancer Paternal Grandfather        lung  . Colon polyps Neg Hx   . Esophageal cancer Neg Hx   . Kidney disease Neg Hx   . Stomach cancer Neg Hx   . Rectal cancer Neg Hx     Social History Social History   Tobacco Use  . Smoking status: Current Some Day Smoker    Types: Cigars  . Smokeless tobacco: Never Used  Substance Use Topics  . Alcohol use: Yes    Alcohol/week: 0.0 standard drinks    Comment: occ  . Drug use: No     Allergies   Ciprofloxacin; Dapsone; and Primaquine   Review of Systems Review of Systems  Constitutional: Negative.  Negative for chills, diaphoresis and fever.  Genitourinary: Negative.  Negative for discharge, dysuria, hematuria, scrotal swelling and testicular pain.  Musculoskeletal: Positive for back pain. Negative for neck pain.  Skin: Negative.  Negative for color change and rash.  Neurological: Negative.  Negative for dizziness, weakness and numbness.       Denies saddle area paresthesias Denies bowel/bladder incontinence     Physical Exam Updated Vital Signs BP 126/78 (BP Location: Right Arm)   Pulse 64   Temp 98.2 F (36.8 C)   Resp 20   Ht 6\' 1"  (1.854 m)   Wt 97.5 kg   SpO2 99%   BMI 28.36 kg/m   Physical Exam  Constitutional: He is oriented to person,  place, and time. He appears well-developed and well-nourished. No distress.  HENT:  Head: Normocephalic and atraumatic.  Right Ear: External ear normal.  Left Ear: External ear normal.  Nose: Nose normal.  Eyes: Pupils are equal, round, and reactive to light. EOM are normal.  Neck: Trachea normal and normal range of motion. No tracheal deviation present.  Cardiovascular:  Pulses:      Dorsalis pedis pulses are 2+ on the right side, and 2+ on the left side.       Posterior tibial pulses are 2+ on the right side, and 2+ on the left side.  Pulmonary/Chest: Effort normal. No respiratory distress.  Abdominal: Soft. There is no tenderness. There is no rigidity, no rebound, no guarding, no CVA  tenderness, no tenderness at McBurney's point and negative Murphy's sign.  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  No external genital lesions noted, no bumps on head of penis, specifically no vesicles concerning for herpes or chancre suggestive of syphilis.  No pain with palpation of the penis/glans, no discharge or urethritis noted.  Scrotum and testicles without erythema/swelling or tenderness to palpation. Cremasteric reflex intact bilaterally. No palpable hernia noted.   Musculoskeletal: Normal range of motion. He exhibits no deformity.       Right knee: Normal.       Left knee: Normal.       Cervical back: Normal.       Thoracic back: Normal.       Lumbar back: He exhibits tenderness. He exhibits no bony tenderness, no swelling, no edema and no deformity.       Back:  No midline cervical or thoracic spinal tenderness to palpation, no paraspinal muscle tenderness, no deformity, crepitus, or step-off noted.  No midline lumbar spinal tenderness to palpation.  No step-off crepitus or deformity of the lumbar spine noted. Patient with right gluteal tenderness to palpation.   Feet:  Right Foot:  Protective Sensation: 3 sites tested. 3 sites sensed.   Left Foot:  Protective Sensation: 3 sites tested. 3 sites sensed.  Neurological: He is alert and oriented to person, place, and time. He has normal strength. No sensory deficit. GCS eye subscore is 4. GCS verbal subscore is 5. GCS motor subscore is 6.  Speech is clear and goal oriented, follows commands Major Cranial nerves without deficit, no facial droop Normal strength in upper and lower extremities bilaterally including dorsiflexion and plantar flexion, strong and equal grip strength Sensation normal to light and sharp touch Moves extremities without ataxia, coordination intact Normal gait  Skin: Skin is warm and dry. Capillary refill takes less than 2 seconds.  Psychiatric: He has a normal mood and affect. His behavior is normal.    ED Treatments / Results  Labs (all labs ordered are listed, but only abnormal results are displayed) Labs Reviewed  URINALYSIS, ROUTINE W REFLEX MICROSCOPIC    EKG None  Radiology Dg Lumbar Spine Complete  Result Date: 08/29/2018 CLINICAL DATA:  Back pain EXAM: LUMBAR SPINE - COMPLETE 4+ VIEW COMPARISON:  None. FINDINGS: There is no evidence of lumbar spine fracture. Alignment is normal. Intervertebral disc spaces are maintained. IMPRESSION: Negative. Electronically Signed   By: Jasmine Pang M.D.   On: 08/29/2018 18:31    Procedures Procedures (including critical care time)  Medications Ordered in ED Medications  ketorolac (TORADOL) 15 MG/ML injection 15 mg (15 mg Intramuscular Given 08/29/18 1737)     Initial Impression / Assessment and Plan / ED Course  I have reviewed the triage vital signs and the nursing notes.  Pertinent labs & imaging results that were available during my care of the patient were reviewed by me and considered in my medical decision making (see chart for details).  Clinical Course as of Aug 30 26  Sat Aug 29, 2018  1816 GU exam chaperoned by Janett Billow RN.   [BM]  [phone] Urinalysis refused by patient.  States that  he had a normal UA 6 days ago.   [BM]    Clinical Course User Index [BM] Bill Salinas, PA-C   Lumbar spine imaging negative Urinalysis within normal limits Patient afebrile, not tachycardic, not hypotensive, well-appearing in no acute distress.  Pain is only present with palpation of right  gluteal muscle.  Normal GU exam, cremasteric reflex intact, no testicular pain or swelling.  Patient denying testicular pain during visit today.  Doubt testicular pathology. Urinalysis negative, doubt nephrolithiasis.  Patient presenting with right sided back pain with sciatica No neurological deficits and normal neuro exam.  Patient can walk but states is painful.  Patient denies loss of bowel/bladder control or saddle area paresthesias.  No concern for cauda equina.  No fever, night sweats, weight loss, h/o cancer, or IVDU. RICE protocol and pain medicine indicated and discussed with patient.   Robaxin 500mg  BID prescribed. Patient informed to avoid driving or operating heavy machinery while taking muscle relaxer.  At this time there does not appear to be any evidence of an acute emergency medical condition and the patient appears stable for discharge with appropriate outpatient follow up. Diagnosis was discussed with patient who verbalizes understanding of care plan and is agreeable to discharge. I have discussed return precautions with patient who verbalizes understanding of return precautions. Patient strongly encouraged to follow-up with their PCP. All questions answered.   Note: Portions of this report may have been transcribed using voice recognition software. Every effort was made to ensure accuracy; however, inadvertent computerized transcription errors may still be present.   Final Clinical Impressions(s) / ED Diagnoses   Final diagnoses:  Acute right-sided low back pain with right-sided sciatica    ED Discharge Orders         Ordered    methocarbamol (ROBAXIN) 500 MG tablet  2 times  daily     08/29/18 1912           Elizabeth Palau 08/30/18 0031    Bill Salinas, PA-C 08/30/18 0032    Tegeler, Canary Brim, MD 08/30/18 [phone]

## 2018-08-29 NOTE — ED Triage Notes (Signed)
Patient states that he has had pain to his right lower back, hip and down his leg x 1 week. Went to the urgent care earlier this week and had a full work up - Reports that it is not getting any better

## 2018-08-29 NOTE — Discharge Instructions (Addendum)
Please return to the Emergency Department for any new or worsening symptoms or if your symptoms do not improve. Please be sure to follow up with your Primary Care Physician as soon as possible regarding your visit today. If you do not have a Primary Doctor please use the resources below to establish one. You may use the muscle relaxer Robaxin as prescribed.  Please do not drive or operate heavy machinery while taking this medication because it will make you drowsy.  Contact a doctor if: You have pain that: Wakes you up when you are sleeping. Gets worse when you lie down. Is worse than the pain you have had in the past. Lasts longer than 4 weeks. You lose weight for without trying. Get help right away if: You cannot control when you pee (urinate) or poop (have a bowel movement). You have weakness in any of these areas and it gets worse. Lower back. Lower belly (pelvis). Butt (buttocks). Legs. You have redness or swelling of your back. You have a burning feeling when you pee.  Do not take your medicine if  develop an itchy rash, swelling in your mouth or lips, or difficulty breathing.   RESOURCE GUIDE  Chronic Pain Problems: Contact [facility]  [phone] Patients need to be referred by their primary care doctor.  Insufficient Money for Medicine: Contact United Way:  call "211" or [facility] [phone].  No Primary Care Doctor: Call [facility]  [phone] - can help you locate a primary care doctor that  accepts your insurance, provides certain services, etc. Physician Referral Service- [phone]  Agencies that provide inexpensive medical care: [facility]  [phone] [facility] Internal Medicine  [phone] [facility]  [phone] [facility]  [phone] [facility]  [phone] [facility]  [phone]  Medicaid-accepting [facility] Providers: [facility]- [address], [address]  [phone], Mon-Fri 9am-7pm, Sat 9am-1pm [facility]- [address] [place], Suite [place]  [phone] [facility]- [address], [address] [place]  [phone] [facility]- [address]  [phone] Renaye Rakers- [address] [place], [address], [phone]  Only accepts [place] patients after they have their name  applied to their card  Self Pay (no insurance) in [facility]: Sickle Cell Patients: Dr Willey Blade, [facility] Internal Medicine  [address] [place], [phone] [facility]- [address] [place]  [phone]       [facility]- [address]       -     [facility]- see information above (Speak to [employer] if you do not have insurance)       -  [facility]- [address] [place], [phone]       -  [facility]- [address],  [phone]       -  [facility]- [address] [phone]       -  Dr Julio Sicks-  [address], [address], [place], [phone]       -  [facility]- [address] [phone]       -  [facility]- [address], [phone], also [address], [phone]       -    [facility]- [phone]  [address] [phone], 1st & 3rd Saturday   every month, 10am-1pm  1) Find a Doctor and Pay Out of Pocket Although you won't have to find out who is covered by your insurance plan, it is a good idea to ask around and get recommendations. You will then need to call the office and see if the doctor you have chosen will accept you as a new patient and what types of options they offer for patients who are self-pay. Some doctors offer discounts or will set up payment plans for their patients who do not have insurance, but you will need to ask so you aren't surprised when you get to your appointment.  2) Contact Your Local Health Department Not all health departments have  doctors that can see patients for sick visits, but many do, so it is worth a call to see if yours does. If you don't know where your local health department is, you can check in your phone book. The CDC also has a tool to help you locate your state's health department, and many state websites also have listings of all of their local health departments.  3) Find a Walk-in Clinic If your illness is not likely to be very severe or complicated, you may want to try a walk in clinic. These are popping up all over the country in pharmacies, drugstores, and shopping centers. They're usually staffed by nurse practitioners or physician assistants that have been trained to treat common illnesses and complaints. They're usually fairly quick and inexpensive. However, if you have serious medical issues or chronic medical problems, these are probably not your best option  STD Testing [facility] Department of [facility] [place], STD Clinic, [address], [place], phone [phone] or [phone].  Monday - Friday, call for an appointment. [facility] Department of [employer], STD Clinic, [place] [address], [employer], phone [phone] or [phone].  Monday - Friday, call for an appointment.  Abuse/Neglect: [facility] Child Abuse Hotline 667-619-4707 [facility] Child Abuse Hotline (608) 531-6676 (After Hours)  Emergency Shelter:  [facility] (616) 551-7398  Maternity Homes: Room at the [place] [facility] 506-563-1356 [facility] 6173052195  MRSA Hotline #:   910-223-9006  [facility] Resources  [facility]  United [employer] [facility] Dept. [address]                 [address]         [address] [place] [address]  Blondell Reveal Phone:  [phone]                                  Phone:  [phone]                    Phone:  [phone]  [facility], [phone] [facility] [phone]       -  [facility] in [place], [address]                                  [phone], [facility] Child Abuse Hotline 3135664250 or 902-570-1082 (After Hours)   Behavioral Health Services  Substance Abuse Resources: [facility]  636-149-2829 [facility] [phone]) [phone] [facility] [phone] Floydene Flock ([phone] Residential & Outpatient Substance Abuse Program  [phone]  Psychological Services: [facility]  [phone] [facility]  [phone] [facility], [phone] [place]. [address] [place], ACCESS LINE: (309)543-6807 or 9012507679, [employer]za  Dental Assistance  If unable to pay or uninsured, contact:  [facility] or [facility]. to become qualified for the adult dental clinic.  Patients with Medicaid: [facility] [phone] [address], [phone] [address] [phone]  If unable to pay, or uninsured, contact [facility] (210)825-1801) or [facility] Department [phone] in [place], [phone] in [facility]) to become qualified for the adult dental clinic   Other Low-Cost Community Dental Services: [facility]- [address] [place], [place], [place], [address], [phone], [address]. [phone], 2nd and 4th Thursday of the month at 6:30am.  10 clients each day by appointment, can sometimes see walk-in patients if someone does not show for an appointment. [facility]- [address] [place], [place], [address], [phone] [facility] [address], [place], [place], [address], [phone] [facility] Health Department- [phone] [facility] Health Department- [phone] [facility] [phone]

## 2018-12-14 ENCOUNTER — Other Ambulatory Visit: Payer: Self-pay

## 2018-12-14 ENCOUNTER — Emergency Department (HOSPITAL_BASED_OUTPATIENT_CLINIC_OR_DEPARTMENT_OTHER)
Admission: EM | Admit: 2018-12-14 | Discharge: 2018-12-14 | Disposition: A | Discharge status: Home / Self Care | Payer: Self-pay | Attending: Emergency Medicine | Admitting: Emergency Medicine

## 2018-12-14 ENCOUNTER — Emergency Department (HOSPITAL_BASED_OUTPATIENT_CLINIC_OR_DEPARTMENT_OTHER): Payer: Self-pay

## 2018-12-14 ENCOUNTER — Encounter (HOSPITAL_BASED_OUTPATIENT_CLINIC_OR_DEPARTMENT_OTHER): Payer: Self-pay | Admitting: Emergency Medicine

## 2018-12-14 DIAGNOSIS — Z79899 Other long term (current) drug therapy: Secondary | ICD-10-CM | POA: Insufficient documentation

## 2018-12-14 DIAGNOSIS — B20 Human immunodeficiency virus [HIV] disease: Secondary | ICD-10-CM | POA: Insufficient documentation

## 2018-12-14 DIAGNOSIS — R1110 Vomiting, unspecified: Secondary | ICD-10-CM | POA: Insufficient documentation

## 2018-12-14 DIAGNOSIS — K529 Noninfective gastroenteritis and colitis, unspecified: Secondary | ICD-10-CM | POA: Insufficient documentation

## 2018-12-14 DIAGNOSIS — F17290 Nicotine dependence, other tobacco product, uncomplicated: Secondary | ICD-10-CM | POA: Insufficient documentation

## 2018-12-14 LAB — COMPREHENSIVE METABOLIC PANEL
ALT: 66 U/L — ABNORMAL HIGH (ref 0–44)
ANION GAP: 6 (ref 5–15)
AST: 35 U/L (ref 15–41)
Albumin: 3.9 g/dL (ref 3.5–5.0)
Alkaline Phosphatase: 52 U/L (ref 38–126)
BUN: 12 mg/dL (ref 6–20)
CHLORIDE: 108 mmol/L (ref 98–111)
CO2: 25 mmol/L (ref 22–32)
CREATININE: 1.25 mg/dL — AB (ref 0.61–1.24)
Calcium: 8.7 mg/dL — ABNORMAL LOW (ref 8.9–10.3)
Glucose, Bld: 101 mg/dL — ABNORMAL HIGH (ref 70–99)
Potassium: 3.6 mmol/L (ref 3.5–5.1)
SODIUM: 139 mmol/L (ref 135–145)
Total Bilirubin: 0.5 mg/dL (ref 0.3–1.2)
Total Protein: 7.4 g/dL (ref 6.5–8.1)

## 2018-12-14 LAB — URINALYSIS, ROUTINE W REFLEX MICROSCOPIC
BILIRUBIN URINE: NEGATIVE
Glucose, UA: NEGATIVE mg/dL
HGB URINE DIPSTICK: NEGATIVE
Ketones, ur: NEGATIVE mg/dL
Leukocytes, UA: NEGATIVE
NITRITE: NEGATIVE
PROTEIN: NEGATIVE mg/dL
Specific Gravity, Urine: <1.005 — ABNORMAL LOW (ref 1.005–1.030)
pH: 5.5 (ref 5.0–8.0)

## 2018-12-14 LAB — LIPASE, BLOOD: LIPASE: 25 U/L (ref 11–51)

## 2018-12-14 LAB — CBC WITH DIFFERENTIAL/PLATELET
ABS IMMATURE GRANULOCYTES: 0.04 10*3/uL (ref 0.00–0.07)
BASOS PCT: 0 %
Basophils Absolute: 0 10*3/uL (ref 0.0–0.1)
EOS ABS: 0 10*3/uL (ref 0.0–0.5)
EOS PCT: 0 %
HCT: 41.4 % (ref 39.0–52.0)
Hemoglobin: 13 g/dL (ref 13.0–17.0)
Immature Granulocytes: 0 %
Lymphocytes Relative: 18 %
Lymphs Abs: 1.6 10*3/uL (ref 0.7–4.0)
MCH: 28.1 pg (ref 26.0–34.0)
MCHC: 31.4 g/dL (ref 30.0–36.0)
MCV: 89.6 fL (ref 80.0–100.0)
MONO ABS: 0.5 10*3/uL (ref 0.1–1.0)
MONOS PCT: 6 %
Neutro Abs: 6.7 10*3/uL (ref 1.7–7.7)
Neutrophils Relative %: 76 %
PLATELETS: 200 10*3/uL (ref 150–400)
RBC: 4.62 MIL/uL (ref 4.22–5.81)
RDW: 12.6 % (ref 11.5–15.5)
WBC: 8.9 10*3/uL (ref 4.0–10.5)
nRBC: 0 % (ref 0.0–0.2)

## 2018-12-14 MED ORDER — SODIUM CHLORIDE 0.9 % IV BOLUS
1000.0000 mL | Freq: Once | INTRAVENOUS | Status: AC
  Administered 2018-12-14: 1000 mL via INTRAVENOUS

## 2018-12-14 MED ORDER — ONDANSETRON 4 MG PO TBDP: ORAL_TABLET | ORAL | 0 refills | Status: DC

## 2018-12-14 MED ORDER — DICYCLOMINE HCL 20 MG PO TABS: 20.0000 mg | ORAL_TABLET | Freq: Two times a day (BID) | ORAL | 0 refills | Status: DC | PRN

## 2018-12-14 MED ORDER — IOPAMIDOL (ISOVUE-300) INJECTION 61%
100.0000 mL | Freq: Once | INTRAVENOUS | Status: AC | PRN
  Administered 2018-12-14: 100 mL via INTRAVENOUS

## 2018-12-14 MED ORDER — ONDANSETRON HCL 4 MG/2ML IJ SOLN
4.0000 mg | Freq: Once | INTRAMUSCULAR | Status: AC
  Administered 2018-12-14: 4 mg via INTRAVENOUS
  Filled 2018-12-14: qty 2

## 2018-12-14 NOTE — ED Provider Notes (Signed)
[facility] EMERGENCY DEPARTMENT Provider Note   CSN: [id number] Arrival date & time: 12/14/18  0636     History   Chief Complaint Chief Complaint  Patient presents with  . Diarrhea  . Emesis    HPI Dillon GuardianLeo T Ta Jr. is a 38 y.o. male hx of GERD, HIV (well controlled with meds with nl CD4 count), here presenting with abdominal pain, vomiting.  Patient states that he has intermittent abdominal pain and diarrhea for the last several months.  Over the last week or so, he has more abdominal cramps and more loose stools.  Also started vomiting this morning.  He initially told me that he has some gallbladder problems.  Upon review of records, he had some gallbladder sludge in 2013 and one ultrasound.  Subsequently he had multiple ultrasounds as well as a HIDA scan that was normal.  Patient never had cholecystectomy.  Patient also had a normal colonoscopy in 2016.  The history is provided by the patient.    Past Medical History:  Diagnosis Date  . GERD (gastroesophageal reflux disease)   . HIV disease (HCC)   . Ulcer     Patient Active Problem List   Diagnosis Date Noted  . Anal fissure 02/10/2013  . External hemorrhoids 02/10/2013  . Depressive disorder 02/07/2013  . 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 HIV (human immunodeficiency virus infection) (HCC) 11/03/2012  . Anxiety state 10/02/2012  . Human immunodeficiency virus (HIV) disease (HCC) 11/12/2010    Past Surgical History:  Procedure Laterality Date  . COLONOSCOPY    . UPPER GASTROINTESTINAL ENDOSCOPY    . WISDOM TOOTH EXTRACTION          Home Medications    Prior to Admission medications   Medication Sig Start Date End Date Taking? Authorizing Provider  Darunavir-Cobicisctat-Emtricitabine-Tenofovir Alafenamide (SYMTUZA) 800-150-200-10 MG TABS Take 1 tablet by mouth daily with breakfast.   Yes [provider]  omeprazole (PRILOSEC) 20 MG capsule Take 20 mg by mouth daily.   Yes [provider]  aluminum chloride (DRYSOL) 20 % external solution Apply topically daily. 06/24/17   Vivi BarrackWagoner, Matthew R, DPM  amoxicillin (AMOXIL) 875 MG tablet Take 875 mg by mouth 2 (two) times daily.    [provider]  azithromycin (ZITHROMAX) 250 MG tablet 2 tab PO x 1 day and then 1 tab PO thereafter Patient not taking: Reported on 06/24/2017 01/09/16   Briscoe DeutscherHess, Bryan R, DO  clotrimazole-betamethasone (LOTRISONE) cream Apply 1 application topically 2 (two) times daily. 06/24/17   Vivi BarrackWagoner, Matthew R, DPM  hydrocortisone (ANUSOL-HC) 2.5 % rectal cream Apply rectally 2 times daily 08/27/16   de Villier, Daryl F II, PA  LORazepam (ATIVAN) 1 MG tablet Take 0.5 tablets (0.5 mg total) by mouth at bedtime. Patient not taking: Reported on 06/24/2017 10/31/15   Peyton NajjarHopper, David H, MD  methocarbamol (ROBAXIN) 500 MG tablet Take 1 tablet (500 mg total) by mouth 2 (two) times daily. 08/29/18   Bill SalinasMorelli, Brandon A, PA-C  oseltamivir (TAMIFLU) 75 MG capsule Take 1 capsule (75 mg total) by mouth every 12 (twelve) hours. 10/03/17   Benjiman CorePickering, Nathan, MD  podofilox (CONDYLOX) 0.5 % external solution Apply to palpable external warts twice daily for three days, followed by a four day rest period, and then repeated up to four times. Patient not taking: Reported on 06/24/2017 11/30/15   Hamilton CapriLe, Thao P, DO  STUDY MEDICATION Take 1 tablet by mouth daily. Combination pill: Cobicistat 150 mg, darunavir 800 mg, emtricitabine 200  mg, tenofovir alefenamide 10 mg,  through [facility]    [provider]  traMADol (ULTRAM) 50 MG tablet Take 1 tablet (50 mg total) by mouth every 12 (twelve) hours as needed for severe pain. Patient not taking: Reported on 06/24/2017 08/28/16   Tomasita Crumbleni, Adeleke, MD    Family History Family History  Problem Relation Age of Onset  . Colon cancer Mother [id number]       Died at 6254 or 2255  . Diabetes Father   . Hypertension Father   . Diabetes Sister   . Hypertension Sister   . Psoriasis  Maternal Grandmother   . Hypertension Maternal Grandfather   . Lung cancer Maternal Grandfather   . Diabetes Maternal Grandfather   . Diabetes Paternal Grandmother   . Alzheimer's disease Paternal Grandmother   . Lung cancer Paternal Grandfather        lung  . Colon polyps Neg Hx   . Esophageal cancer Neg Hx   . Kidney disease Neg Hx   . Stomach cancer Neg Hx   . Rectal cancer Neg Hx     Social History Social History   Tobacco Use  . Smoking status: Current Some Day Smoker    Types: Cigars  . Smokeless tobacco: Never Used  Substance Use Topics  . Alcohol use: Yes    Alcohol/week: 0.0 standard drinks    Comment: occ  . Drug use: No     Allergies   Ciprofloxacin; Dapsone; and Primaquine   Review of Systems Review of Systems  Gastrointestinal: Positive for diarrhea and vomiting.  All other systems reviewed and are negative.    Physical Exam Updated Vital Signs BP 108/67 (BP Location: Right Arm)   Pulse 61   Temp 98.4 F (36.9 C)   Resp 16   Ht 6' (1.829 m)   Wt 97.1 kg   SpO2 99%   BMI 29.02 kg/m   Physical Exam Vitals signs and nursing note reviewed.  HENT:     Head: Normocephalic.     Right Ear: Tympanic membrane normal.     Left Ear: Tympanic membrane normal.     Nose: Nose normal.     Mouth/Throat:     Comments: MM slightly dry  Eyes:     Extraocular Movements: Extraocular movements intact.     Pupils: Pupils are equal, round, and reactive to light.  Neck:     Musculoskeletal: Normal range of motion.  Cardiovascular:     Rate and Rhythm: Normal rate and regular rhythm.  Pulmonary:     Effort: Pulmonary effort is normal.     Breath sounds: Normal breath sounds.  Abdominal:     General: Abdomen is flat.     Palpations: Abdomen is soft.     Comments: Mild periumbilical and epigastric tenderness, no RUQ tenderness, neg murphy's   Musculoskeletal: Normal range of motion.  Skin:    General: Skin is warm.     Capillary Refill: Capillary refill  takes less than 2 seconds.  Neurological:     General: No focal deficit present.     Mental Status: He is alert.  Psychiatric:        Mood and Affect: Mood normal.        Behavior: Behavior normal.      ED Treatments / Results  Labs (all labs ordered are listed, but only abnormal results are displayed) Labs Reviewed  COMPREHENSIVE METABOLIC PANEL - Abnormal; Notable for the following components:  Result Value   Glucose, Bld 101 (*)    Creatinine, Ser 1.25 (*)    Calcium 8.7 (*)    ALT 66 (*)    All other components within normal limits  URINALYSIS, ROUTINE W REFLEX MICROSCOPIC - Abnormal; Notable for the following components:   Specific Gravity, Urine <1.005 (*)    All other components within normal limits  CBC WITH DIFFERENTIAL/PLATELET  LIPASE, BLOOD    EKG None  Radiology Ct Abdomen Pelvis W Contrast  Result Date: 12/14/2018 CLINICAL DATA:  Abdominal distension and diarrhea for 1 week EXAM: CT ABDOMEN AND PELVIS WITH CONTRAST TECHNIQUE: Multidetector CT imaging of the abdomen and pelvis was performed using the standard protocol following bolus administration of intravenous contrast. CONTRAST:  ISOVUE-300 IOPAMIDOL (ISOVUE-300) INJECTION 61% COMPARISON:  None. FINDINGS: Lower chest: No acute abnormality. Hepatobiliary: No focal liver abnormality is seen. No gallstones, gallbladder wall thickening, or biliary dilatation. Pancreas: Unremarkable. No pancreatic ductal dilatation or surrounding inflammatory changes. Spleen: Normal in size without focal abnormality. Adrenals/Urinary Tract: Adrenal glands are within normal limits. The kidneys demonstrate no renal calculi or obstructive changes. The ureters are within normal limits. The bladder is decompressed. Stomach/Bowel: The appendix is within normal limits. Mild fluid is noted within the colon consistent with the diarrheal state. No obstructive or inflammatory changes are seen. The stomach is within normal limits.  Vascular/Lymphatic: No significant vascular findings are present. No enlarged abdominal or pelvic lymph nodes. Reproductive: Prostate is unremarkable. Other: No abdominal wall hernia or abnormality. No abdominopelvic ascites. Musculoskeletal: No acute bony abnormality noted. IMPRESSION: Mild fluid within the colon consistent with the recent history of diarrhea. No other focal abnormality is noted. Electronically Signed   By: Alcide Clever M.D.   On: 12/14/2018 07:50    Procedures Procedures (including critical care time)  Medications Ordered in ED Medications  sodium chloride 0.9 % bolus 1,000 mL ( Intravenous Stopped 12/14/18 0851)  ondansetron (ZOFRAN) injection 4 mg (4 mg Intravenous Given 12/14/18 0746)  iopamidol (ISOVUE-300) 61 % injection 100 mL (100 mLs Intravenous Contrast Given 12/14/18 0730)     Initial Impression / Assessment and Plan / ED Course  I have reviewed the triage vital signs and the nursing notes.  Pertinent labs & imaging results that were available during my care of the patient were reviewed by me and considered in my medical decision making (see chart for details).    Aydren Register. is a 38 y.o. male here with abdominal pain, vomiting, diarrhea.  This is a recurrent problem for the patient.  He does have HIV that is well controlled.  He does have frequent episodes of vomiting and diarrhea and had multiple ultrasound as well as colonoscopy before.  I think patient likely has underlying IBS.  Given that he does have some periumbilical tenderness and has never had appendectomy, will get labs, CT, urinalysis to further assess.  9:41 AM Labs unremarkable. CT showed liquid stool in colon with no obvious colitis. I think likely viral gastro. Will dc home with zofran, bentyl   Final Clinical Impressions(s) / ED Diagnoses   Final diagnoses:  None    ED Discharge Orders    None       Charlynne Pander, MD 12/14/18 [id number]

## 2018-12-14 NOTE — ED Notes (Signed)
Patient transported to X-ray 

## 2018-12-14 NOTE — Discharge Instructions (Signed)
Take bentyl for cramps  Take zofran for nausea.   Stay hydrated.   You likely have a stomach virus.   Take imodium for diarrhea   See your doctor  Return to ER if you have worse abdominal pain, vomiting, fever

## 2018-12-14 NOTE — ED Triage Notes (Signed)
Pt reports one week of diarrhea and vomiting starting this morning. Reports hx gallbladder "issues."

## 2019-03-16 DIAGNOSIS — A630 Anogenital (venereal) warts: Secondary | ICD-10-CM | POA: Insufficient documentation

## 2020-07-03 IMAGING — CT CT ABD-PELV W/ CM
2 of 4 series · 16 of 46 positions shown, 18 images · IV contrast (APPLIED)
Comparison: None.

CLINICAL DATA: Abdominal distension and diarrhea for 1 week

EXAM:
CT ABDOMEN AND PELVIS WITH CONTRAST
TECHNIQUE: Multidetector CT imaging of the abdomen and pelvis was performed
using the standard protocol following bolus administration of
intravenous contrast.
CONTRAST:  100mL 9A5LZW-Q44 IOPAMIDOL (9A5LZW-Q44) INJECTION 61%

[Series 2: axial st · axial · 0.79mm/px · z∈[-642,-127]mm · 13 of 113 slices shown, 15 images]
[im 5/113  soft-tissue]
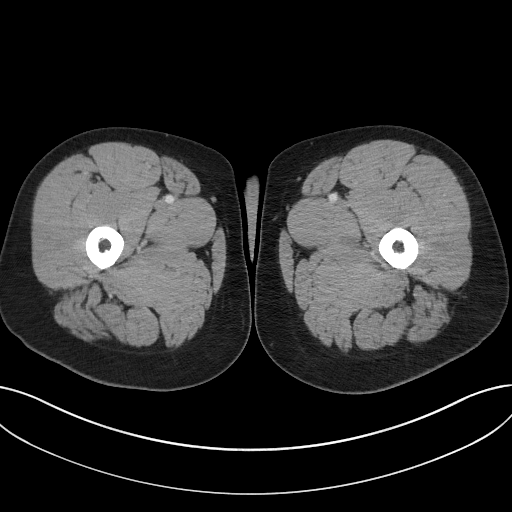
[im 5/113  bone]
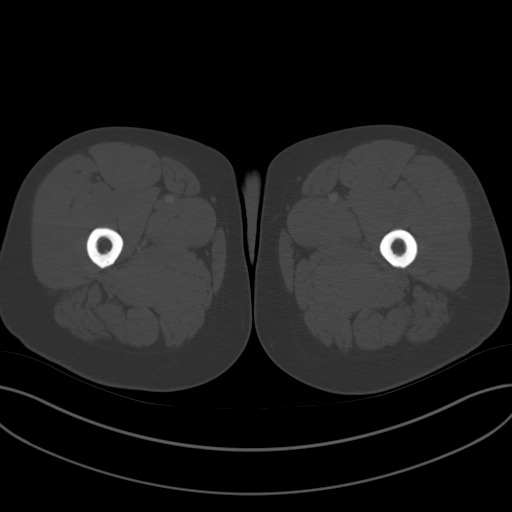
[im 14/113  soft-tissue]
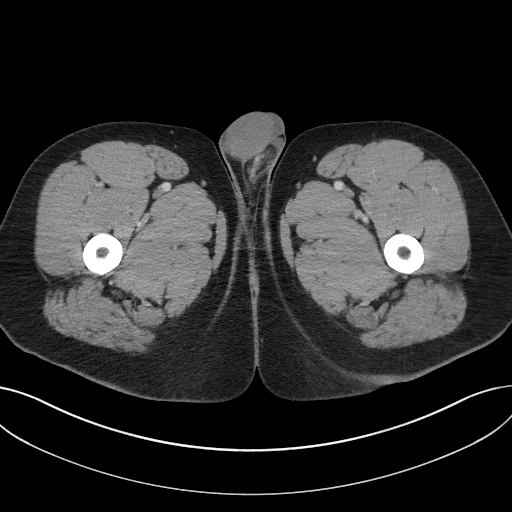
[im 23/113  soft-tissue]
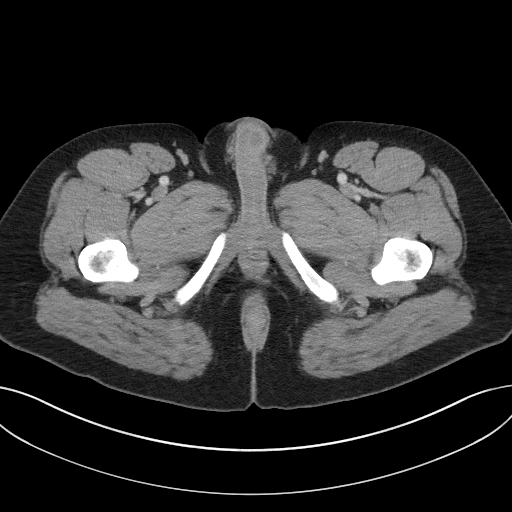
[im 32/113  soft-tissue]
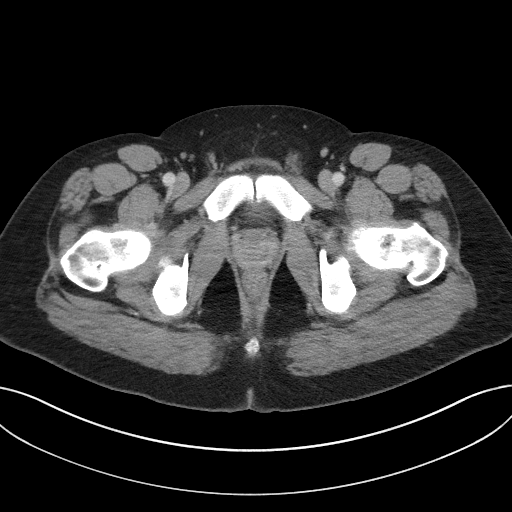
[im 41/113  soft-tissue]
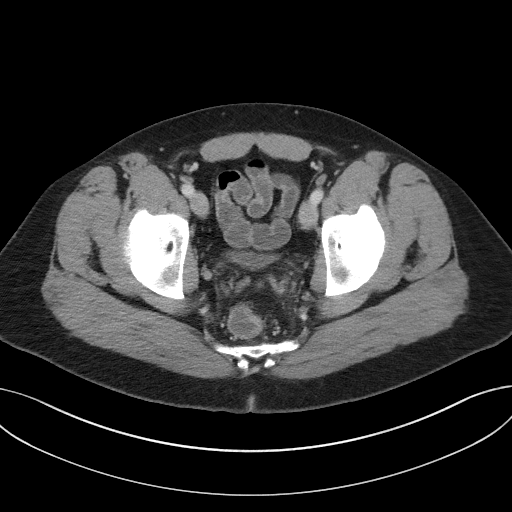
[im 50/113  soft-tissue]
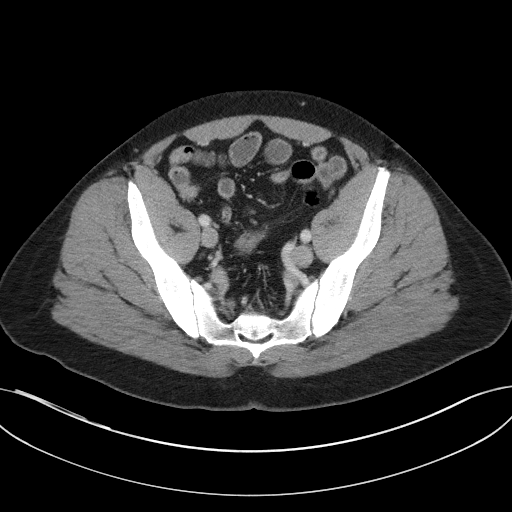
[im 59/113  soft-tissue]
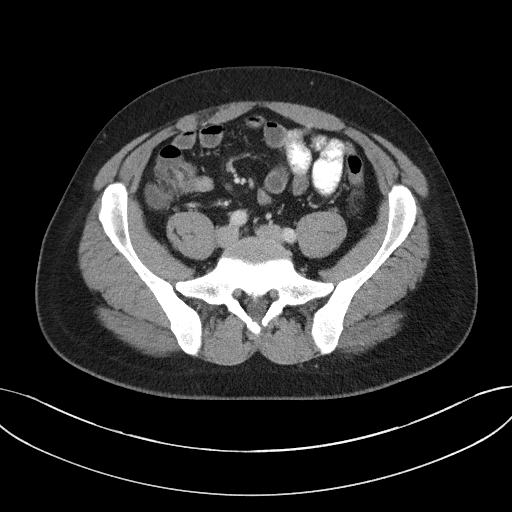
[im 63/113  soft-tissue]
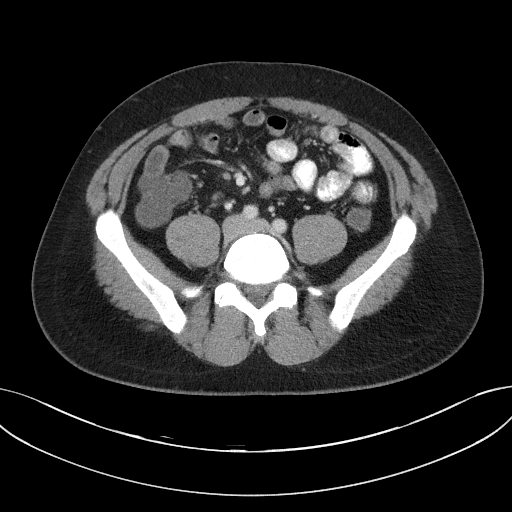
[im 72/113  soft-tissue]
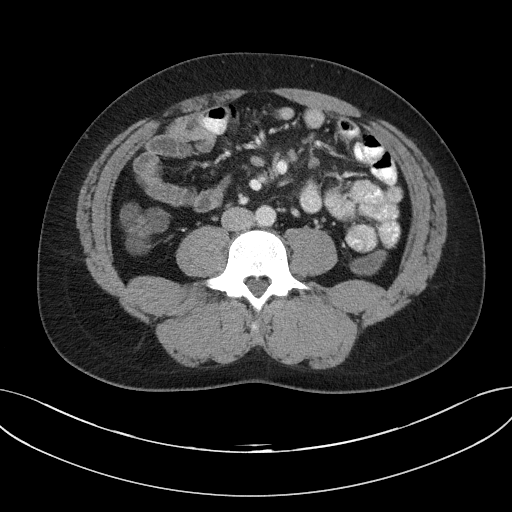
[im 72/113  bone]
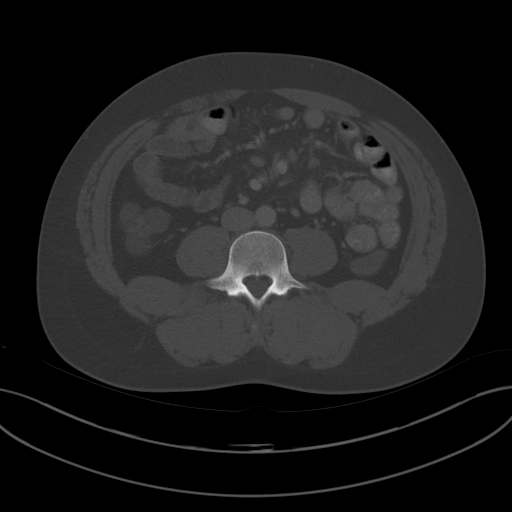
[im 81/113  soft-tissue]
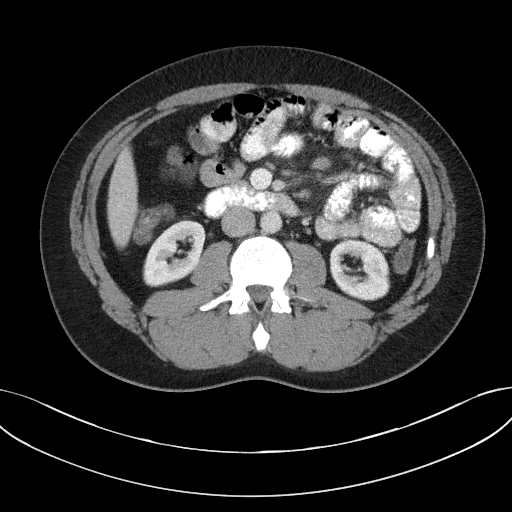
[im 90/113  soft-tissue]
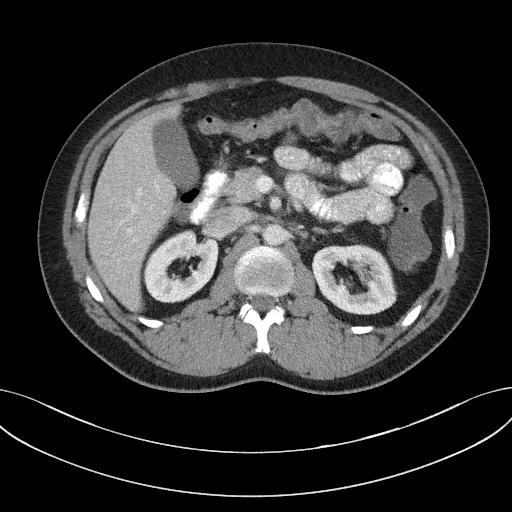
[im 99/113  soft-tissue]
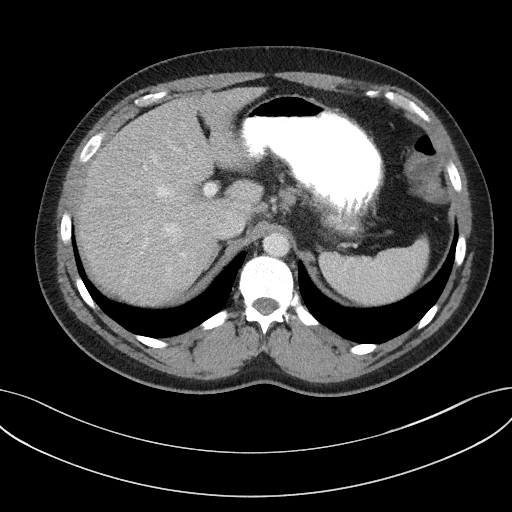
[im 108/113  soft-tissue]
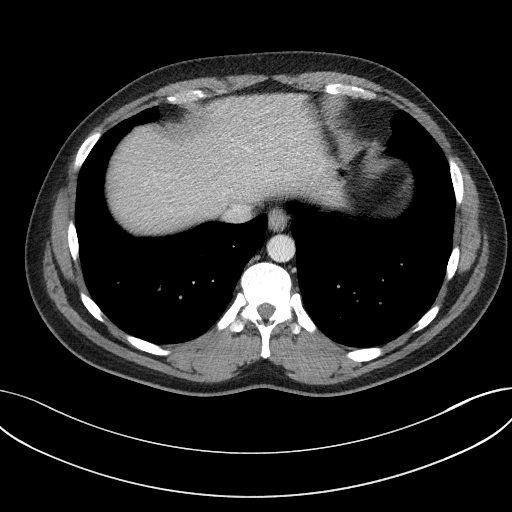

[Series 4: coronal st · coronal · 0.94mm/px · 3 of 93 slices shown]
[im 31/93  soft-tissue]
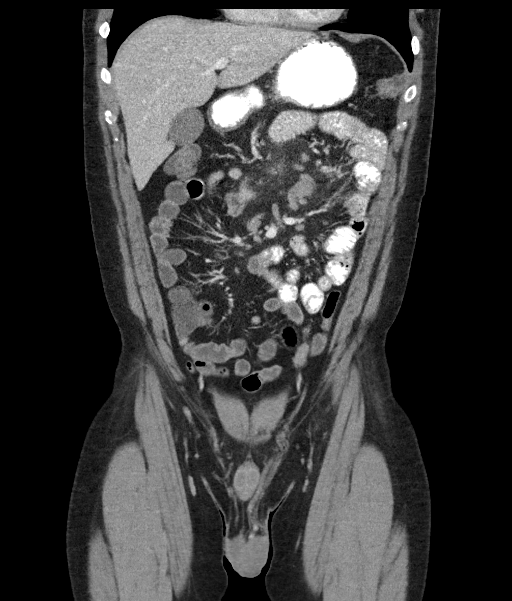
[im 41/93  soft-tissue]
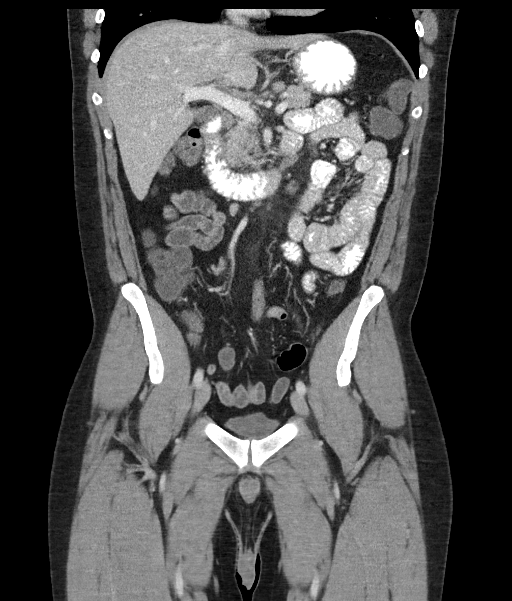
[im 52/93  soft-tissue]
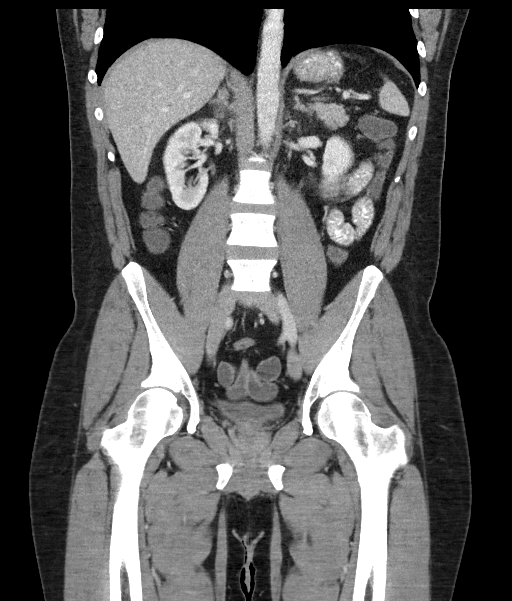

[16 of 46 positions shown; findings below may reference images not displayed]

FINDINGS: Lower chest: No acute abnormality.

Hepatobiliary: No focal liver abnormality is seen. No gallstones,
gallbladder wall thickening, or biliary dilatation.

Pancreas: Unremarkable. No pancreatic ductal dilatation or
surrounding inflammatory changes.

Spleen: Normal in size without focal abnormality.

Adrenals/Urinary Tract: Adrenal glands are within normal limits. The
kidneys demonstrate no renal calculi or obstructive changes. The
ureters are within normal limits. The bladder is decompressed.

Stomach/Bowel: The appendix is within normal limits. Mild fluid is
noted within the colon consistent with the diarrheal state. No
obstructive or inflammatory changes are seen. The stomach is within
normal limits.

Vascular/Lymphatic: No significant vascular findings are present. No
enlarged abdominal or pelvic lymph nodes.

Reproductive: Prostate is unremarkable.

Other: No abdominal wall hernia or abnormality. No abdominopelvic
ascites.

Musculoskeletal: No acute bony abnormality noted.
IMPRESSION: Mild fluid within the colon consistent with the recent history of
diarrhea.

No other focal abnormality is noted.

## 2021-04-05 ENCOUNTER — Emergency Department (HOSPITAL_BASED_OUTPATIENT_CLINIC_OR_DEPARTMENT_OTHER)
Admission: EM | Admit: 2021-04-05 | Discharge: 2021-04-05 | Disposition: A | Discharge status: Home / Self Care | Payer: 59 | Attending: Emergency Medicine | Admitting: Emergency Medicine

## 2021-04-05 ENCOUNTER — Other Ambulatory Visit: Payer: Self-pay

## 2021-04-05 ENCOUNTER — Encounter (HOSPITAL_BASED_OUTPATIENT_CLINIC_OR_DEPARTMENT_OTHER): Payer: Self-pay | Admitting: Obstetrics and Gynecology

## 2021-04-05 DIAGNOSIS — Z21 Asymptomatic human immunodeficiency virus [HIV] infection status: Secondary | ICD-10-CM | POA: Diagnosis not present

## 2021-04-05 DIAGNOSIS — K629 Disease of anus and rectum, unspecified: Secondary | ICD-10-CM | POA: Diagnosis present

## 2021-04-05 DIAGNOSIS — F17290 Nicotine dependence, other tobacco product, uncomplicated: Secondary | ICD-10-CM | POA: Insufficient documentation

## 2021-04-05 MED ORDER — CEPHALEXIN 500 MG PO CAPS: 500.0000 mg | ORAL_CAPSULE | Freq: Two times a day (BID) | ORAL | 0 refills | Status: AC

## 2021-04-05 NOTE — ED Provider Notes (Signed)
[facility] EMERGENCY DEPT Provider Note   CSN: [id number] Arrival date & time: 04/05/21  1740     History Chief Complaint  Patient presents with  . Abscess    Dillon Wilson. is a 40 y.o. male with history of HIV on antivirals presenting to emergency department with concern for a "small bump near the anus".  He says he is noticed this a few days ago and feels like he is having some discomfort when he wipes.  He denies significant pain otherwise, including no pain with defecation and no pain with sitting on it.  He denies fevers or chills.  He has been compliant all his medications.  He is not on antibiotics currently.  HPI     Past Medical History:  Diagnosis Date  . GERD (gastroesophageal reflux disease)   . HIV disease (HCC)   . Ulcer     Patient Active Problem List   Diagnosis Date Noted  . Anal fissure 02/10/2013  . External hemorrhoids 02/10/2013  . Depressive disorder 02/07/2013  . 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 HIV (human immunodeficiency virus infection) (HCC) 11/03/2012  . Anxiety state 10/02/2012  . Human immunodeficiency virus (HIV) disease (HCC) 11/12/2010    Past Surgical History:  Procedure Laterality Date  . COLONOSCOPY    . UPPER GASTROINTESTINAL ENDOSCOPY    . WISDOM TOOTH EXTRACTION         Family History  Problem Relation Age of Onset  . Colon cancer Mother 110       Died at 32 or 72  . Diabetes Father   . Hypertension Father   . Diabetes Sister   . Hypertension Sister   . Psoriasis Maternal Grandmother   . Hypertension Maternal Grandfather   . Lung cancer Maternal Grandfather   . Diabetes Maternal Grandfather   . Diabetes Paternal Grandmother   . Alzheimer's disease Paternal Grandmother   . Lung cancer Paternal Grandfather        lung  . Colon polyps Neg Hx   . Esophageal cancer Neg Hx   . Kidney disease Neg Hx   . Stomach cancer Neg Hx   . Rectal cancer Neg Hx     Social History   Tobacco Use  . Smoking  status: Current Some Day Smoker    Types: Cigars  . Smokeless tobacco: Never Used  Vaping Use  . Vaping Use: Never used  Substance Use Topics  . Alcohol use: Yes    Alcohol/week: 0.0 standard drinks    Comment: occ  . Drug use: No    Home Medications Prior to Admission medications   Medication Sig Start Date End Date Taking? Authorizing Provider  cephALEXin (KEFLEX) 500 MG capsule Take 1 capsule (500 mg total) by mouth 2 (two) times daily for 5 days. 04/05/21 04/10/21 Yes Trig Mcbryar, Kermit Balo, MD  aluminum chloride (DRYSOL) 20 % external solution Apply topically daily. 06/24/17   Vivi Barrack, DPM  amoxicillin (AMOXIL) 875 MG tablet Take 875 mg by mouth 2 (two) times daily.    [provider]  azithromycin (ZITHROMAX) 250 MG tablet 2 tab PO x 1 day and then 1 tab PO thereafter Patient not taking: Reported on 06/24/2017 01/09/16   Briscoe Deutscher, DO  clotrimazole-betamethasone (LOTRISONE) cream Apply 1 application topically 2 (two) times daily. 06/24/17   Vivi Barrack, DPM  Darunavir-Cobicisctat-Emtricitabine-Tenofovir Alafenamide (SYMTUZA) 800-150-200-10 MG TABS Take 1 tablet by mouth daily with breakfast.    [provider]  dicyclomine (BENTYL)  20 MG tablet Take 1 tablet (20 mg total) by mouth 2 (two) times daily as needed for spasms. 12/14/18   Charlynne Pander, MD  hydrocortisone (ANUSOL-HC) 2.5 % rectal cream Apply rectally 2 times daily 08/27/16   de Villier, Daryl F II, PA  LORazepam (ATIVAN) 1 MG tablet Take 0.5 tablets (0.5 mg total) by mouth at bedtime. Patient not taking: Reported on 06/24/2017 10/31/15   Peyton Najjar, MD  methocarbamol (ROBAXIN) 500 MG tablet Take 1 tablet (500 mg total) by mouth 2 (two) times daily. 08/29/18   Harlene Salts A, PA-C  omeprazole (PRILOSEC) 20 MG capsule Take 20 mg by mouth daily.    [provider]  ondansetron (ZOFRAN ODT) 4 MG disintegrating tablet 4mg  ODT q4 hours prn nausea/vomit 12/14/18   12/16/18,  MD  oseltamivir (TAMIFLU) 75 MG capsule Take 1 capsule (75 mg total) by mouth every 12 (twelve) hours. 10/03/17   13/2/18, MD  podofilox (CONDYLOX) 0.5 % external solution Apply to palpable external warts twice daily for three days, followed by a four day rest period, and then repeated up to four times. Patient not taking: Reported on 06/24/2017 11/30/15   12/02/15 P, DO  STUDY MEDICATION Take 1 tablet by mouth daily. Combination pill: Cobicistat 150 mg, darunavir 800 mg, emtricitabine 200 mg, tenofovir alefenamide 10 mg,  through [facility]    [provider]  traMADol (ULTRAM) 50 MG tablet Take 1 tablet (50 mg total) by mouth every 12 (twelve) hours as needed for severe pain. Patient not taking: Reported on 06/24/2017 08/28/16   08/30/16, MD    Allergies    Ciprofloxacin, Dapsone, and Primaquine  Review of Systems   Review of Systems  Constitutional: Negative for chills and fever.  Cardiovascular: Negative for palpitations.  Gastrointestinal: Negative for anal bleeding and blood in stool.  Skin: Negative for rash and wound.  Neurological: Negative for syncope and light-headedness.  Psychiatric/Behavioral: Negative for agitation and confusion.  All other systems reviewed and are negative.   Physical Exam Updated Vital Signs BP (!) 138/99   Pulse 72   Temp 98.4 F (36.9 C)   Resp (!) 8   Ht 6' (1.829 m)   Wt 96.6 kg   SpO2 100%   BMI 28.89 kg/m   Physical Exam Constitutional:      General: He is not in acute distress. HENT:     Head: Normocephalic and atraumatic.  Eyes:     Conjunctiva/sclera: Conjunctivae normal.     Pupils: Pupils are equal, round, and reactive to light.  Cardiovascular:     Rate and Rhythm: Normal rate and regular rhythm.  Genitourinary:    Comments: Gu exam performed with Tomasita Crumble RN as chaperone present Small skin tag on external anus Small possible ingrown hair noted at 9 o clock position No anal  drainage or erythema, no thrombosed hemorrhoids Skin:    General: Skin is warm and dry.  Neurological:     General: No focal deficit present.     Mental Status: He is alert. Mental status is at baseline.     ED Results / Procedures / Treatments   Labs (all labs ordered are listed, but only abnormal results are displayed) Labs Reviewed - No data to display  EKG None  Radiology No results found.  Procedures Procedures   Medications Ordered in ED Medications - No data to display  ED Course  I have reviewed the triage vital signs  and the nursing notes.  Pertinent labs & imaging results that were available during my care of the patient were reviewed by me and considered in my medical decision making (see chart for details).  Patient is here with small skin tag versus ingrown hair on the external rectum. No evidence of significant infection.  No fluctuant pocket to suggest perirectal renal abscess.  He has no tenderness on exam.  No evidence of acute deepspace infection.  We discussed short course of Keflex for possible ingrown hair.  Okay for discharge.     Final Clinical Impression(s) / ED Diagnoses Final diagnoses:  Anal lesion    Rx / DC Orders ED Discharge Orders         Ordered    cephALEXin (KEFLEX) 500 MG capsule  2 times daily        04/05/21 2043           Terald Sleeper, MD 04/05/21 2046

## 2021-04-05 NOTE — ED Triage Notes (Signed)
Patient reports he believes he has a peri-rectal abscess. Patient reports he feels it more when he wipes

## 2021-10-30 ENCOUNTER — Ambulatory Visit (INDEPENDENT_AMBULATORY_CARE_PROVIDER_SITE_OTHER): Payer: 59 | Admitting: Podiatry

## 2021-10-30 ENCOUNTER — Other Ambulatory Visit: Payer: Self-pay

## 2021-10-30 ENCOUNTER — Encounter: Payer: Self-pay | Admitting: Podiatry

## 2021-10-30 DIAGNOSIS — M79675 Pain in left toe(s): Secondary | ICD-10-CM

## 2021-10-30 DIAGNOSIS — M79674 Pain in right toe(s): Secondary | ICD-10-CM | POA: Diagnosis not present

## 2021-10-30 DIAGNOSIS — L600 Ingrowing nail: Secondary | ICD-10-CM | POA: Diagnosis not present

## 2021-11-02 NOTE — Progress Notes (Signed)
Subjective:   Patient ID: Dillon Wilson., male   DOB: 40 y.o.   MRN: [id number]   HPI 40 year old male presents the office today with concerns of ingrown toenail left third digit toenail, lateral aspect as well as right medial nail border, lateral aspect.  The third digit to the left foot is the most symptomatic.  Denies any swelling or redness or any drainage.  Tender with pressure in with shoes.   Review of Systems  All other systems reviewed and are negative.  Past Medical History:  Diagnosis Date   GERD (gastroesophageal reflux disease)    HIV disease (HCC)    Ulcer     Past Surgical History:  Procedure Laterality Date   COLONOSCOPY     UPPER GASTROINTESTINAL ENDOSCOPY     WISDOM TOOTH EXTRACTION       Current Outpatient Medications:    omeprazole (PRILOSEC) 20 MG capsule, Take 1 capsule by mouth daily., Disp: , Rfl:    BIKTARVY 50-200-25 MG TABS tablet, Take 1 tablet by mouth daily., Disp: , Rfl:   Allergies  Allergen Reactions   Ciprofloxacin Diarrhea and Nausea Only   Dapsone Other (See Comments)    G6PD deficiency   Primaquine     Other reaction(s): Other (See Comments) G6PD deficiency          Objective:  Physical Exam  General: AAO x3, NAD  Dermatological: Incurvation present to the lateral aspect of the left third digit toenail as well as the lateral right hallux toenail.  There is no drainage or pus or any erythema.  The area is tender however.  No open lesions.  Vascular: Dorsalis Pedis artery and Posterior Tibial artery pedal pulses are 2/4 bilateral with immedate capillary fill time. There is no pain with calf compression, swelling, warmth, erythema.   Neruologic: Grossly intact via light touch bilateral.   Musculoskeletal: Tenderness of the ingrown toenail but no other area discomfort. Muscular strength 5/5 in all groups tested bilateral.  Gait: Unassisted, Nonantalgic.       Assessment:   Ingrown toenails     Plan:  -Treatment  options discussed including all alternatives, risks, and complications -Etiology of symptoms were discussed -Discussed conservative as well as surgical options.  Discussed partial nail removal but today we debrided the nail and complications.  If symptoms continue we will proceed with partial nail avulsion but he wants to schedule for this.  Vivi Barrack DPM

## 2021-11-06 ENCOUNTER — Ambulatory Visit: Payer: 59 | Admitting: Podiatry

## 2021-12-10 ENCOUNTER — Other Ambulatory Visit: Payer: Self-pay

## 2021-12-10 ENCOUNTER — Ambulatory Visit (INDEPENDENT_AMBULATORY_CARE_PROVIDER_SITE_OTHER): Payer: 59 | Admitting: Podiatry

## 2021-12-10 DIAGNOSIS — M2042 Other hammer toe(s) (acquired), left foot: Secondary | ICD-10-CM | POA: Diagnosis not present

## 2021-12-10 DIAGNOSIS — M2041 Other hammer toe(s) (acquired), right foot: Secondary | ICD-10-CM | POA: Diagnosis not present

## 2021-12-10 DIAGNOSIS — L600 Ingrowing nail: Secondary | ICD-10-CM | POA: Diagnosis not present

## 2021-12-10 NOTE — Patient Instructions (Signed)

## 2021-12-13 NOTE — Progress Notes (Signed)
Subjective: 41 year old male presents the office today for concerns of ingrown toenail left third toe, lateral border.  He states he is not sure it is actually ingrown toenail but he realized that his shoes are too tight causing irritation that is causing the issue.  The nail border does cause discomfort but denies any swelling or redness or any drainage and has no other concerns today.  Objective: AAO x3, NAD DP/PT pulses palpable bilaterally, CRT less than 3 seconds Incurvation present to lateral border left third digit toenail and there is also some callus skin present just adjacent to the nail.  There is no drainage or pus or any signs of infection.  Hammertoe, adductovarus present No pain with calf compression, swelling, warmth, erythema  Assessment: Ingrown toenail, hammertoe  Plan: -All treatment options discussed with the patient including all alternatives, risks, complications.  -We discussed partial nail avulsion but he would start to follow-up on this today Unchanged issues.  I did debride them about portion of the nail with any complications with them.  Monitor for any signs or symptoms of infection or reoccurrence of symptoms continue we will proceed with partial nail avulsion. -Patient encouraged to call the office with any questions, concerns, change in symptoms.   Trula Slade DPM

## 2022-09-18 ENCOUNTER — Emergency Department (HOSPITAL_BASED_OUTPATIENT_CLINIC_OR_DEPARTMENT_OTHER)
Admission: EM | Admit: 2022-09-18 | Discharge: 2022-09-18 | Disposition: A | Discharge status: Home / Self Care | Payer: 59 | Attending: Emergency Medicine | Admitting: Emergency Medicine

## 2022-09-18 ENCOUNTER — Encounter (HOSPITAL_BASED_OUTPATIENT_CLINIC_OR_DEPARTMENT_OTHER): Payer: Self-pay

## 2022-09-18 ENCOUNTER — Other Ambulatory Visit: Payer: Self-pay

## 2022-09-18 DIAGNOSIS — J029 Acute pharyngitis, unspecified: Secondary | ICD-10-CM | POA: Diagnosis present

## 2022-09-18 DIAGNOSIS — J020 Streptococcal pharyngitis: Secondary | ICD-10-CM | POA: Diagnosis not present

## 2022-09-18 DIAGNOSIS — Z21 Asymptomatic human immunodeficiency virus [HIV] infection status: Secondary | ICD-10-CM | POA: Insufficient documentation

## 2022-09-18 LAB — GROUP A STREP BY PCR: Group A Strep by PCR: DETECTED — AB

## 2022-09-18 MED ORDER — PENICILLIN G BENZATHINE 1200000 UNIT/2ML IM SUSY
1.2000 10*6.[IU] | PREFILLED_SYRINGE | Freq: Once | INTRAMUSCULAR | Status: AC
  Administered 2022-09-18: 1.2 10*6.[IU] via INTRAMUSCULAR
  Filled 2022-09-18: qty 2

## 2022-09-18 NOTE — ED Provider Notes (Signed)
[facility] EMERGENCY DEPT Provider Note  CSN: [id number] Arrival date & time: 09/18/22 0130  Chief Complaint(s) Sore Throat  HPI Dillon Wilson. is a 41 y.o. male    The history is provided by the patient.  Sore Throat This is a new problem. The current episode started yesterday. The problem occurs constantly. The problem has been gradually worsening. Associated symptoms include headaches (sinus. now resolved). Pertinent negatives include no chest pain, no abdominal pain and no shortness of breath. The symptoms are aggravated by swallowing. Nothing relieves the symptoms. He has tried nothing for the symptoms.   Similar to prior strep pharyngitis.  Patient has a history of HIV compliant with Biktarvy.  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 Authorizing Provider  BIKTARVY 50-200-25 MG TABS tablet Take 1 tablet by mouth daily. 10/11/21   [provider]  omeprazole (PRILOSEC) 20 MG capsule Take 1 capsule by mouth daily. 02/14/17   [provider]                                                                                                                                    Allergies Ciprofloxacin, Dapsone, and Primaquine  Review of Systems Review of Systems   Respiratory:  Negative for shortness of breath.   Cardiovascular:  Negative for chest pain.  Gastrointestinal:  Negative for abdominal pain.  Neurological:  Positive for headaches (sinus. now resolved).   As noted in HPI  Physical Exam Vital Signs  I have reviewed the triage vital signs BP (!) 152/100 (BP Location: Right Arm)   Pulse 84   Temp 98.4 F (36.9 C) (Oral)   Resp 18   Ht 6\' 1"  (1.854 m)   Wt 90.7 kg   SpO2 100%   BMI 26.39 kg/m   Physical Exam Vitals reviewed.  Constitutional:      General: He is not in acute distress.    Appearance: He is well-developed. He is not diaphoretic.  HENT:     Head: Normocephalic and atraumatic.     Right Ear: External ear normal.     Left Ear: External ear normal.     Nose: Nose normal.     Mouth/Throat:     Mouth: Mucous membranes are moist. No  angioedema.     Pharynx: Posterior oropharyngeal erythema present.     Tonsils: Tonsillar exudate (mild) present. No tonsillar abscesses. 1+ on the right. 1+ on the left.  Eyes:     General: No scleral icterus.    Conjunctiva/sclera: Conjunctivae normal.  Neck:     Trachea: Phonation normal.  Cardiovascular:     Rate and Rhythm: Normal rate and regular rhythm.  Pulmonary:     Effort: Pulmonary effort is normal. No respiratory distress.     Breath sounds: No stridor.  Abdominal:     General: There is no distension.  Musculoskeletal:        General: Normal range of motion.     Cervical back: Normal range of motion.  Neurological:     Mental Status: He is alert and oriented to person, place, and time.  Psychiatric:        Behavior: Behavior normal.     ED Results and Treatments Labs (all labs ordered are listed, but only abnormal results are displayed) Labs Reviewed  GROUP A STREP BY PCR - Abnormal; Notable for the following components:      Result Value   Group A Strep by PCR DETECTED (*)    All other components within normal limits                                                                                                                          EKG  EKG Interpretation  Date/Time:    Ventricular Rate:    PR Interval:    QRS Duration:   QT Interval:    QTC Calculation:   R Axis:     Text Interpretation:         Radiology No results found.  Medications Ordered in ED Medications  penicillin g benzathine (BICILLIN LA) 1200000 UNIT/2ML injection 1.2 Million Units (has no administration in time range)                                                                                                                                     Procedures Procedures  (including critical care time)  Medical Decision Making / ED Course   Medical Decision Making Amount and/or Complexity of Data Reviewed Labs: ordered. Decision-making details documented in ED Course.     Complexity of Problem:  Co-morbidities/SDOH that complicate the patient evaluation/care: HIV  Additional history obtained: On review of  records, patient had a recent clinic follow-up about a month ago for HIV and had undetectable quant and normal CD4 counts  Patient's presenting problem/concern, DDX, and MDM listed below: Sore throat Patient reports similar to prior strep throat Also considering viral pharyngitis Less likely opportunistic infection    Complexity of Data:   Laboratory Tests ordered listed below with my independent interpretation: Strep throat positive    ED Course:    Assessment, Add'l Intervention, and Reassessment: Strep pharyngitis Patient opted to be treated with Bicillin which was given in the emergency department.       Final Clinical Impression(s) / ED Diagnoses Final diagnoses:  Strep pharyngitis   The patient appears reasonably screened and/or stabilized for discharge and I doubt any other medical condition or other [facility] requiring further screening, evaluation, or treatment in the ED at this time. I have discussed the findings, Dx and Tx plan  with the patient/family who expressed understanding and agree(s) with the plan. Discharge instructions discussed at length. The patient/family was given strict return precautions who verbalized understanding of the instructions. No further questions at time of discharge.  Disposition: Discharge  Condition: Good  ED Discharge Orders     None         Follow Up: Primary care provider  Call  to schedule an appointment for close follow up           This chart was dictated using voice recognition software.  Despite best efforts to proofread,  errors can occur which can change the documentation meaning.    Nira Conn, MD 09/18/22 [phone]

## 2022-09-18 NOTE — ED Triage Notes (Signed)
Pt presents to the ED with sore throat that started Monday. Pt also reports diarrhea that started on Monday. Denies abdominal pain, nausea, or vomiting. Denies fevers, cough, or congestion.

## 2023-11-09 ENCOUNTER — Emergency Department (HOSPITAL_BASED_OUTPATIENT_CLINIC_OR_DEPARTMENT_OTHER)
Admission: EM | Admit: 2023-11-09 | Discharge: 2023-11-10 | Disposition: A | Discharge status: Home / Self Care | Payer: 59 | Attending: Emergency Medicine | Admitting: Emergency Medicine

## 2023-11-09 ENCOUNTER — Other Ambulatory Visit: Payer: Self-pay

## 2023-11-09 ENCOUNTER — Emergency Department (HOSPITAL_BASED_OUTPATIENT_CLINIC_OR_DEPARTMENT_OTHER): Payer: 59 | Admitting: Radiology

## 2023-11-09 ENCOUNTER — Encounter (HOSPITAL_BASED_OUTPATIENT_CLINIC_OR_DEPARTMENT_OTHER): Payer: Self-pay

## 2023-11-09 DIAGNOSIS — M25512 Pain in left shoulder: Secondary | ICD-10-CM | POA: Diagnosis not present

## 2023-11-09 DIAGNOSIS — R202 Paresthesia of skin: Secondary | ICD-10-CM | POA: Insufficient documentation

## 2023-11-09 DIAGNOSIS — M79602 Pain in left arm: Secondary | ICD-10-CM | POA: Insufficient documentation

## 2023-11-09 DIAGNOSIS — M542 Cervicalgia: Secondary | ICD-10-CM | POA: Insufficient documentation

## 2023-11-09 NOTE — ED Triage Notes (Signed)
PT to triage via self ambulation c/o left arm shoulder pain 10/10 sharp in nature resulting from fall on 10/25/23. No obvious deformities Pt denies LOC Full ROM in all 4 extremities.  VSS NAD PT on room air.

## 2023-11-10 ENCOUNTER — Emergency Department (HOSPITAL_BASED_OUTPATIENT_CLINIC_OR_DEPARTMENT_OTHER): Payer: 59

## 2023-11-10 LAB — BASIC METABOLIC PANEL
Anion gap: 5 (ref 5–15)
BUN: 16 mg/dL (ref 6–20)
CO2: 29 mmol/L (ref 22–32)
Calcium: 9.3 mg/dL (ref 8.9–10.3)
Chloride: 105 mmol/L (ref 98–111)
Creatinine, Ser: 1.24 mg/dL (ref 0.61–1.24)
GFR, Estimated: >60 mL/min (ref >60)
Glucose, Bld: 85 mg/dL (ref 70–99)
Potassium: 4.2 mmol/L (ref 3.5–5.1)
Sodium: 139 mmol/L (ref 135–145)

## 2023-11-10 MED ORDER — IOHEXOL 350 MG/ML SOLN
100.0000 mL | Freq: Once | INTRAVENOUS | Status: AC | PRN
  Administered 2023-11-10: 75 mL via INTRAVENOUS

## 2023-11-10 MED ORDER — GABAPENTIN 100 MG PO CAPS: 100.0000 mg | ORAL_CAPSULE | Freq: Three times a day (TID) | ORAL | 0 refills | Status: DC

## 2023-11-10 MED ORDER — GABAPENTIN 100 MG PO CAPS
100.0000 mg | ORAL_CAPSULE | Freq: Once | ORAL | Status: AC
  Administered 2023-11-10: 100 mg via ORAL
  Filled 2023-11-10: qty 1

## 2023-11-10 NOTE — ED Provider Notes (Signed)
Dillon Wilson   CSN: [id number] Arrival date & time: 11/09/23  2105     History  Chief Complaint  Patient presents with   Dillon Quint. is a 42 y.o. male.  42 year old presents ER today with neck pain, shoulder pain and arm pain.  Patient states he fell a couple weeks ago.  Initially just having pain in his shoulder that was tingling in nature.  He points to right above his clavicle where the pain is.  States it feels like needles.  Worse with laying on it, moving his head a certain way, moving his arm a certain way.  Patient states that sometimes it will go down his arm and into his hand.  Sometimes he will have the tingling and stuff in his fingers as well.  Has had an x-ray of his shoulder on the second which was negative symptoms persisted despite at home remedies so presents here for repeat evaluation.  Did not have anything else.  No loss of consciousness.  No other traumatic injury related to the fall.  No ecchymosis.   Fall       Home Medications Prior to Admission medications   Medication Sig Start Date End Date Taking? Authorizing Provider  BIKTARVY 50-200-25 MG TABS tablet Take 1 tablet by mouth daily. 10/11/21   [provider]  gabapentin (NEURONTIN) 100 MG capsule Take 1 capsule (100 mg total) by mouth 3 (three) times daily. 11/10/23   Temiloluwa Recchia, Barbara Cower, MD  omeprazole (PRILOSEC) 20 MG capsule Take 1 capsule by mouth daily. 02/14/17   [provider]      Allergies    Ciprofloxacin, Dapsone, and Primaquine    Review of Systems   Review of Systems  Physical Exam Updated Vital Signs BP 133/80 (BP Location: Right Arm)   Pulse 67   Temp 98 F (36.7 C) (Oral)   Resp 18   Wt 93 kg   SpO2 99%   BMI 27.05 kg/m  Physical Exam Vitals and nursing Wilson reviewed.  Constitutional:      Appearance: He is well-developed.  HENT:     Head: Normocephalic and atraumatic.      Mouth/Throat:     Mouth: Mucous membranes are moist.     Pharynx: Oropharynx is clear.  Cardiovascular:     Rate and Rhythm: Normal rate.  Pulmonary:     Effort: Pulmonary effort is normal. No respiratory distress.  Abdominal:     General: There is no distension.  Musculoskeletal:        General: Normal range of motion.     Cervical back: Normal range of motion.     Comments: Some discomfort with range of motion and palpation of certain areas strength is good.  Sensation is good.  Neurological:     Mental Status: He is alert.     ED Results / Procedures / Treatments   Labs (all labs ordered are listed, but only abnormal results are displayed) Labs Reviewed  BASIC METABOLIC PANEL    EKG None  Radiology CT Angio Neck W and/or Wo Contrast  Result Date: 11/10/2023 CLINICAL DATA:  Fall on 10/25/2023, neck and shoulder pain. EXAM: CT ANGIOGRAPHY NECK CT CERVICAL SPINE WITHOUT CONTRAST TECHNIQUE: Multidetector CT imaging of the neck was performed using the standard protocol during bolus administration of intravenous contrast. Multiplanar CT image reconstructions and MIPs were obtained to evaluate the vascular anatomy. Carotid stenosis measurements (when applicable) are obtained  utilizing NASCET criteria, using the distal internal carotid diameter as the denominator. Multidetector CT imaging of the cervical spine was performed without intravenous contrast. Multiplanar CT image reconstructions were also generated. RADIATION DOSE REDUCTION: This exam was performed according to the departmental dose-optimization program which includes automated exposure control, adjustment of the mA and/or kV according to patient size and/or use of iterative reconstruction technique. CONTRAST:  75mL OMNIPAQUE IOHEXOL 350 MG/ML SOLN COMPARISON:  None Available. FINDINGS: CTA NECK Aortic arch: Four-vessel arch, with the left vertebral artery originating from the aorta. Imaged portion shows no evidence of aneurysm  or dissection. No significant stenosis of the major arch vessel origins. Right carotid system: No evidence of stenosis, dissection, or occlusion. Left carotid system: No evidence of stenosis, dissection, or occlusion. Vertebral arteries: No evidence of stenosis, dissection, or occlusion. Other neck: 6 mm hypodense lesion in the left thyroid lobe, for which no follow-up is currently indicated. (Reference: J Am Coll Radiol. 2015 Feb;12(2): 143-50) Upper chest: No focal pulmonary opacity or pleural effusion. CT CERVICAL SPINE Alignment: No traumatic listhesis. Straightening of the normal cervical lordosis, which may be positional. Skull base and vertebrae: No acute fracture. No primary bone lesion or focal pathologic process. Soft tissues and spinal canal: No prevertebral fluid or swelling. No visible canal hematoma. Disc levels:  No high-grade spinal canal stenosis. Upper chest: Negative. IMPRESSION: 1. No evidence of acute vascular injury in the neck. 2. No acute fracture or traumatic listhesis in the cervical spine. Electronically Signed   By: Wiliam Ke M.D.   On: 11/10/2023 02:47   CT C-SPINE NO CHARGE  Result Date: 11/10/2023 CLINICAL DATA:  Fall on 10/25/2023, neck and shoulder pain. EXAM: CT ANGIOGRAPHY NECK CT CERVICAL SPINE WITHOUT CONTRAST TECHNIQUE: Multidetector CT imaging of the neck was performed using the standard protocol during bolus administration of intravenous contrast. Multiplanar CT image reconstructions and MIPs were obtained to evaluate the vascular anatomy. Carotid stenosis measurements (when applicable) are obtained utilizing NASCET criteria, using the distal internal carotid diameter as the denominator. Multidetector CT imaging of the cervical spine was performed without intravenous contrast. Multiplanar CT image reconstructions were also generated. RADIATION DOSE REDUCTION: This exam was performed according to the departmental dose-optimization program which includes automated  exposure control, adjustment of the mA and/or kV according to patient size and/or use of iterative reconstruction technique. CONTRAST:  75mL OMNIPAQUE IOHEXOL 350 MG/ML SOLN COMPARISON:  None Available. FINDINGS: CTA NECK Aortic arch: Four-vessel arch, with the left vertebral artery originating from the aorta. Imaged portion shows no evidence of aneurysm or dissection. No significant stenosis of the major arch vessel origins. Right carotid system: No evidence of stenosis, dissection, or occlusion. Left carotid system: No evidence of stenosis, dissection, or occlusion. Vertebral arteries: No evidence of stenosis, dissection, or occlusion. Other neck: 6 mm hypodense lesion in the left thyroid lobe, for which no follow-up is currently indicated. (Reference: J Am Coll Radiol. 2015 Feb;12(2): 143-50) Upper chest: No focal pulmonary opacity or pleural effusion. CT CERVICAL SPINE Alignment: No traumatic listhesis. Straightening of the normal cervical lordosis, which may be positional. Skull base and vertebrae: No acute fracture. No primary bone lesion or focal pathologic process. Soft tissues and spinal canal: No prevertebral fluid or swelling. No visible canal hematoma. Disc levels:  No high-grade spinal canal stenosis. Upper chest: Negative. IMPRESSION: 1. No evidence of acute vascular injury in the neck. 2. No acute fracture or traumatic listhesis in the cervical spine. Electronically Signed   By: Wiliam Ke  M.D.   On: 11/10/2023 02:47   DG Shoulder Left  Result Date: 11/09/2023 CLINICAL DATA:  Fall EXAM: LEFT SHOULDER - 2+ VIEW COMPARISON:  None Available. FINDINGS: No fracture or dislocation is seen. The joint spaces are preserved. The visualized soft tissues are unremarkable. Visualized left lung is clear. IMPRESSION: Negative. Electronically Signed   By: Charline Bills M.D.   On: 11/09/2023 22:51    Procedures Procedures    Medications Ordered in ED Medications  iohexol (OMNIPAQUE) 350 MG/ML  injection 100 mL (75 mLs Intravenous Contrast Given 11/10/23 0220)  gabapentin (NEURONTIN) capsule 100 mg (100 mg Oral Given 11/10/23 0329)    ED Course/ Medical Decision Making/ A&P                                 Medical Decision Making Amount and/or Complexity of Data Reviewed Labs: ordered. Radiology: ordered.  Risk Prescription drug management.  Concern for some type of neck injury.  He points right above his clavicle so evaluate for vascular and also neurologic causes.  Low suspicion for musculoskeletal injury at this time with 2 negative x-rays of his shoulder.  This stuff is negative we will likely start Neurontin and have him follow-up with his primary doctor for MRI. CT angio without any vascular or obvious cervical injuries.  Still could be muscular however the pain seems more consistent with a neurologic cause so we will start Neurontin and suggest PCP follow-up for MRI if not improving.  Return here for any worsening pain, paresthesias or weakness.  Final Clinical Impression(s) / ED Diagnoses Final diagnoses:  Paresthesia and pain of left extremity    Rx / DC Orders ED Discharge Orders          Ordered    gabapentin (NEURONTIN) 100 MG capsule  3 times daily,   Status:  Discontinued        11/10/23 0320    gabapentin (NEURONTIN) 100 MG capsule  3 times daily        11/10/23 0320              Dillon Wilson, Barbara Cower, MD 11/10/23 [phone]

## 2023-11-22 ENCOUNTER — Emergency Department (HOSPITAL_BASED_OUTPATIENT_CLINIC_OR_DEPARTMENT_OTHER)
Admission: EM | Admit: 2023-11-22 | Discharge: 2023-11-22 | Disposition: A | Discharge status: Home / Self Care | Payer: 59 | Attending: Emergency Medicine | Admitting: Emergency Medicine

## 2023-11-22 ENCOUNTER — Other Ambulatory Visit: Payer: Self-pay

## 2023-11-22 ENCOUNTER — Encounter (HOSPITAL_BASED_OUTPATIENT_CLINIC_OR_DEPARTMENT_OTHER): Payer: Self-pay

## 2023-11-22 DIAGNOSIS — M5410 Radiculopathy, site unspecified: Secondary | ICD-10-CM

## 2023-11-22 DIAGNOSIS — X58XXXA Exposure to other specified factors, initial encounter: Secondary | ICD-10-CM | POA: Insufficient documentation

## 2023-11-22 DIAGNOSIS — Z21 Asymptomatic human immunodeficiency virus [HIV] infection status: Secondary | ICD-10-CM | POA: Insufficient documentation

## 2023-11-22 DIAGNOSIS — S43402A Unspecified sprain of left shoulder joint, initial encounter: Secondary | ICD-10-CM | POA: Diagnosis not present

## 2023-11-22 DIAGNOSIS — M542 Cervicalgia: Secondary | ICD-10-CM | POA: Diagnosis not present

## 2023-11-22 DIAGNOSIS — M25512 Pain in left shoulder: Secondary | ICD-10-CM | POA: Diagnosis present

## 2023-11-22 MED ORDER — KETOROLAC TROMETHAMINE 60 MG/2ML IM SOLN
60.0000 mg | Freq: Once | INTRAMUSCULAR | Status: AC
  Administered 2023-11-22: 60 mg via INTRAMUSCULAR
  Filled 2023-11-22: qty 2

## 2023-11-22 MED ORDER — ACETAMINOPHEN 500 MG PO TABS
1000.0000 mg | ORAL_TABLET | Freq: Once | ORAL | Status: AC
  Administered 2023-11-22: 1000 mg via ORAL
  Filled 2023-11-22: qty 2

## 2023-11-22 MED ORDER — LIDOCAINE 5 % EX PTCH
1.0000 | MEDICATED_PATCH | CUTANEOUS | Status: DC
  Administered 2023-11-22: 1 via TRANSDERMAL
  Filled 2023-11-22: qty 1

## 2023-11-22 MED ORDER — METHYLPREDNISOLONE 4 MG PO TBPK: ORAL_TABLET | ORAL | 0 refills | Status: AC

## 2023-11-22 MED ORDER — DIAZEPAM 2 MG PO TABS
2.0000 mg | ORAL_TABLET | Freq: Once | ORAL | Status: AC
  Administered 2023-11-22: 2 mg via ORAL
  Filled 2023-11-22: qty 1

## 2023-11-22 MED ORDER — CYCLOBENZAPRINE HCL 10 MG PO TABS: 10.0000 mg | ORAL_TABLET | Freq: Two times a day (BID) | ORAL | 0 refills | Status: DC | PRN

## 2023-11-22 NOTE — ED Provider Notes (Signed)
[facility] Provider Note   CSN: [id number] Arrival date & time: 11/22/23  4098     History  Chief Complaint  Patient presents with   Shoulder Pain    Dillon Koepp. is a 42 y.o. male.  Patient here with ongoing left shoulder pain left-sided neck pain, numbness in his left hand.  He has been followed by neurology on Neurontin.  They suspect radiculopathy from his cervical spine.  Started after a trauma few weeks ago.  He has had images done to his shoulder and neck that have been unremarkable.  He is scheduled for MRI and start physical therapy this week.  Patient has tried Robaxin, Neurontin.  Did steroids a few weeks ago.  Denies any new trauma.  Denies any new weakness or numbness.  Things have been very stiff and it is very hard for him to lie down and move at times.  He has a history of HIV.  Denies any fever or chills.  The history is provided by the patient.       Home Medications Prior to Admission medications   Medication Sig Start Date End Date Taking? Authorizing Provider  cyclobenzaprine (FLEXERIL) 10 MG tablet Take 1 tablet (10 mg total) by mouth 2 (two) times daily as needed for muscle spasms. 11/22/23  Yes Lynnwood Beckford, DO  methylPREDNISolone (MEDROL DOSEPAK) 4 MG TBPK tablet Follow package insert 11/22/23  Yes Lanson Randle, DO  BIKTARVY 50-200-25 MG TABS tablet Take 1 tablet by mouth daily. 10/11/21   [provider]  gabapentin (NEURONTIN) 100 MG capsule Take 1 capsule (100 mg total) by mouth 3 (three) times daily. 11/10/23   Mesner, Barbara Cower, MD  omeprazole (PRILOSEC) 20 MG capsule Take 1 capsule by mouth daily. 02/14/17   [provider]      Allergies    Ciprofloxacin, Dapsone, and Primaquine    Review of Systems   Review of Systems  Physical Exam Updated Vital Signs BP 119/85   Pulse 76   Temp 98.2 F (36.8 C) (Oral)   Resp 18   SpO2 98%  Physical Exam Vitals and nursing note  reviewed.  Constitutional:      General: He is not in acute distress.    Appearance: He is well-developed. He is not ill-appearing.  HENT:     Head: Normocephalic and atraumatic.     Nose: Nose normal.     Mouth/Throat:     Mouth: Mucous membranes are moist.  Eyes:     Extraocular Movements: Extraocular movements intact.     Conjunctiva/sclera: Conjunctivae normal.     Pupils: Pupils are equal, round, and reactive to light.  Cardiovascular:     Rate and Rhythm: Normal rate and regular rhythm.     Pulses: Normal pulses.     Heart sounds: No murmur heard. Pulmonary:     Effort: Pulmonary effort is normal. No respiratory distress.     Breath sounds: Normal breath sounds.  Abdominal:     Palpations: Abdomen is soft.     Tenderness: There is no abdominal tenderness.  Musculoskeletal:        General: Tenderness present. No swelling. Normal range of motion.     Cervical back: Normal range of motion and neck supple.     Comments: Tender in the left trapezius area, left paracervical muscles  Skin:    General: Skin is warm and dry.     Capillary Refill: Capillary refill takes less than 2  seconds.  Neurological:     General: No focal deficit present.     Mental Status: He is alert and oriented to person, place, and time.     Cranial Nerves: No cranial nerve deficit.     Sensory: No sensory deficit.     Motor: No weakness.     Comments: Some decrease sensation of the ulnar portion of the left hand but otherwise strength and sensations intact  Psychiatric:        Mood and Affect: Mood normal.     ED Results / Procedures / Treatments   Labs (all labs ordered are listed, but only abnormal results are displayed) Labs Reviewed - No data to display  EKG None  Radiology No results found.  Procedures Procedures    Medications Ordered in ED Medications  lidocaine (LIDODERM) 5 % 1 patch (1 patch Transdermal Patch Applied 11/22/23 0836)  ketorolac (TORADOL) injection 60 mg (60 mg  Intramuscular Given 11/22/23 0836)  diazepam (VALIUM) tablet 2 mg (2 mg Oral Given 11/22/23 0836)  acetaminophen (TYLENOL) tablet 1,000 mg (1,000 mg Oral Given 11/22/23 4098)    ED Course/ Medical Decision Making/ A&P                                 Medical Decision Making Risk OTC drugs. Prescription drug management.   Vara Guardian. is here with left shoulder left-sided neck pain.  Been dealing with this for several weeks.  He supposed to have attempt at MRI again as he was not able to tolerate an MRI earlier, physical therapy starting this week.  He has had some left shoulder left-sided neck pain since fall a couple weeks ago.  He is had x-rays of his left shoulder CT scan of his neck that are unremarkable.  He is neurovascular neuromuscular intact for the most part on exam.  He had a little bit of numbness to the lateral portion of his left hand which has been there but he does not have any new numbness or weakness.  No new trauma.  Have no concern for infectious process.  This does seem to be some sort of radiculopathy or bad muscle spasm.  Things have been exacerbated as he has not really been able to find position of ease much.  I think he is got a pretty good muscle spasm going on as well.  He has had reassuring images thus far.  I think pursuing outpatient MRI is reasonable but his exam right now is unremarkable.  He is got subjective numbness in his hand but strength seems intact.  I will refer him to sports medicine to work along with neurology as well.  He has physical therapy starting this week.  Patient given Valium, Toradol, Tylenol, lidocaine patch in the ED.  Will prescribe Medrol Dosepak for home.  He understands return precautions.  Discharged in good condition.  At this time I do not think there is any need for emergent MRI and he can continue outpatient workup for this process.  Clinically I do not think there is any cord compression going on.  This chart was dictated using  voice recognition software.  Despite best efforts to proofread,  errors can occur which can change the documentation meaning.         Final Clinical Impression(s) / ED Diagnoses Final diagnoses:  Neck pain  Sprain of left shoulder, unspecified shoulder sprain type, initial encounter  Radiculopathy, unspecified spinal region    Rx / DC Orders ED Discharge Orders          Ordered    methylPREDNISolone (MEDROL DOSEPAK) 4 MG TBPK tablet        11/22/23 1002    cyclobenzaprine (FLEXERIL) 10 MG tablet  2 times daily PRN        11/22/23 1002              Kaine Mcquillen, DO 11/22/23 1002

## 2023-11-22 NOTE — ED Triage Notes (Signed)
Pt presents with complaints of left neck & shoulder. Pt also endorses numbness in left hand and pinky finger. Pt fell and injury left shoulder on 9/23. Went to do MRI yesterday but unsuccessful due to unable to lay on back.

## 2023-11-24 ENCOUNTER — Encounter: Payer: Self-pay | Admitting: Family Medicine

## 2023-11-24 ENCOUNTER — Ambulatory Visit (INDEPENDENT_AMBULATORY_CARE_PROVIDER_SITE_OTHER): Payer: 59 | Admitting: Family Medicine

## 2023-11-24 VITALS — BP 126/84 | Ht 73.0 in | Wt 205.0 lb

## 2023-11-24 DIAGNOSIS — M25512 Pain in left shoulder: Secondary | ICD-10-CM | POA: Diagnosis not present

## 2023-11-24 DIAGNOSIS — M25812 Other specified joint disorders, left shoulder: Secondary | ICD-10-CM

## 2023-11-24 MED ORDER — TRIAMCINOLONE ACETONIDE 40 MG/ML IJ SUSP
40.0000 mg | Freq: Once | INTRAMUSCULAR | Status: AC
  Administered 2023-11-24: 40 mg via INTRA_ARTICULAR

## 2023-11-24 NOTE — Progress Notes (Unsigned)
CHIEF COMPLAINT: No chief complaint on file.  _____________________________________________________________ SUBJECTIVE  HPI  Pt is a 42 y.o. male here for evaluation of L upper back/ L shoulder pain  Accident 11/22/23, fell down the stairs, shares that his R foot was a little too far under the step, grabbed the rail and fell down the stairs and his shoulder trailed behind Reports difficulty turning himself to the left without substantial pain. Also reporting difficulty looking down. Has been unable to lay down on his back. Pain in shoulder is posterior in location, also with left upper back pain following trap distribution Pain in the back feels like a twisted knife, which causes numbness Therapies tried so far: 300mg  gabapentin TID, flexeril 10mg  BID, medrol dosepak (currently on day 3, but hasn't taken breakfast or lunch dose yet)  Seen in the emergency room 11/22/2023, note reviewed: -Presented for left shoulder pain and left-sided neck pain with numbness to his left hand -Recently seen by neurology, on Neurontin, suspected radiculopathy from cervical spine sustained after a trauma few weeks ago.  Shoulder next x-rays have been unremarkable, scheduled for MRI and to start physical therapy this week.  Therapies have included Robaxin and Neurontin, as well as steroids a few weeks ago.  Was unable to tolerate MRI -On physical exam tenderness in the left trap and paracervical muscles were identified -Given Valium, Toradol, Tylenol, lidocaine patch, Medrol Dosepak, discharged and referred to sports medicine  11/19/2023 neurology note reviewed: -Slipped and fell down the stairs around Thanksgiving, falling on his back.  Few days later developed severe pain in his left arm specifically pinky, ring, middle fingers accompanied by tingling sensation, originating from the left side of his neck extending to his shoulder blade, then rating down to the elbow, described as a heat and pain -Also with left  side of his neck and trapezius pain, disrupting sleep -No weakness in arm, but difficulty in movement, has not done physical therapy or home stretches for his neck, no bowel or bladder incontinence, however very down during bowel movements exacerbates pain, as has coughing and sneezing -Gabapentin provided intermittent relief without side effects, was also seen orthopedic urgent care, given prednisone Dosepak and muscle relaxers which were not helpful -Cervical spine MRI scheduled 12/20 -On exam had decreased pinprick and light touch in right C8 distribution, decreased neck rotation to the left due to pain, no definite Spurling sign -Suspected C8 cervical radiculopathy, ambulatory referral to physical therapy had been placed, increased gabapentin to 300 mg 3 times daily. -Anticipated follow-up 12/31/2023  11/09/2023 ED note reviewed: Larey Seat a couple of weeks ago, was given Neurontin at that time  11/03/2023 L shoulder XR obtained through [facility] 11/03/2023 2:59 PM EST  1.  No acute fracture. 2.  No malalignment. 3.  Mild degenerative changes of the acromioclavicular joint.   CTA neck, CT Cspine12/9/24 IMPRESSION: 1. No evidence of acute vascular injury in the neck. 2. No acute fracture or traumatic listhesis in the cervical spine. On independent review also with some endplate osteophytic changes  11/09/2023 shoulder x-ray independently reviewed, showing preserved GHJ spacing, no significant degenerative changes of the glenohumeral joint or AC joint, scap Y view unremarkable  ------------------------------------------------------------------------------------------------------ Past Medical History:  Diagnosis Date   GERD (gastroesophageal reflux disease)    HIV disease (HCC)    Ulcer     Past Surgical History:  Procedure Laterality Date   COLONOSCOPY     UPPER GASTROINTESTINAL ENDOSCOPY     WISDOM TOOTH EXTRACTION  Outpatient Encounter Medications as of 11/24/2023  Medication  Sig   BIKTARVY 50-200-25 MG TABS tablet Take 1 tablet by mouth daily.   cyclobenzaprine (FLEXERIL) 10 MG tablet Take 1 tablet (10 mg total) by mouth 2 (two) times daily as needed for muscle spasms.   gabapentin (NEURONTIN) 100 MG capsule Take 1 capsule (100 mg total) by mouth 3 (three) times daily.   methylPREDNISolone (MEDROL DOSEPAK) 4 MG TBPK tablet Follow package insert   omeprazole (PRILOSEC) 20 MG capsule Take 1 capsule by mouth daily.   No facility-administered encounter medications on file as of 11/24/2023.    ------------------------------------------------------------------------------------------------------  _____________________________________________________________ OBJECTIVE  PHYSICAL EXAM  Today's Vitals   11/24/23 1415  BP: 126/84  Weight: 205 lb (93 kg)  Height: 6\' 1"  (1.854 m)   Body mass index is 27.05 kg/m.   reviewed  General: A+Ox3, no acute distress, well-nourished, appropriate affect CV: pulses 2+ regular, nondiaphoretic, no peripheral edema, cap refill <2sec Lungs: no audible wheezing, non-labored breathing, bilateral chest rise/fall, nontachypneic Skin: warm, well-perfused, non-icteric, no susp lesions or rashes Neuro: muscle tone wnl, no atrophy Psych: no signs of depression or anxiety MSK:    L Shoulder:  No deformity, swelling or muscle wasting No scapular winging FF 180, abd 180, int 0, ext 90 TTP subacromial space, deltoid, trapezius, teres major, periscapular musculature. Palpable scattered trigger points identified on exam NTTP over the [place], clavicle, ac, coracoid, biceps groove, humerus, cervical spine +neer, +hawkins, -empty can, -scarf test, -hornblower, -subscap liftoff, -speeds, -obriens Negative Spurling's test bilat Limitations with neck ROM d/t pain _____________________________________________________________ ASSESSMENT/PLAN Diagnoses and all orders for this visit:  Impingement of left shoulder -     triamcinolone acetonide  (KENALOG-40) injection 40 mg  Trigger point of left shoulder region  Recently diagnosed with C8 cervical radiculopathy, here with left shoulder/ left upper back pain, exam findings consistent with c/f shoulder impingement, upper back muscle strain with several trigger points found today. Discussed options for management today to try to offer enough relief to allow Dillon Wilson to tolerate lying down for an MRI. He will continue with medications that were prescribed by ED 2 days ago. Would likely benefit from PT, which he would like to have addressed at a later time. As for pain management, he elected to trial SAB CSI today with plans to implement consistent heat and massage therapy to treat trigger points.   Subacromial Bursa Injection Site: left  After PARQ discussed and verbal consent given, the site was cleaned with chlorhexidine. Steroid injection was performed using sterile technique with 4mL 1% lidocaine, 1mL 40mg /mL kenalog and 25G 1.5in needle. This was well tolerated and resulted in little relief. Dressing placed and post injection instructions were given including a discussion of likely return of pain today after the anesthetic wears off (with the possibility of worsened pain) until the steroid starts to work in 1-3 days. Call or return to clinic prn if these symptoms worsen or fail to improve as anticipated.  Discussed that if SAB CSI does not offer relief, can trial trigger point injection. All questions answered. Return precautions discussed. Patient verbalized understanding and is in agreement with plan. Follow-up PRN  Electronically signed by: Burna Forts, MD 11/24/2023 1:04 PM

## 2024-01-13 ENCOUNTER — Encounter (HOSPITAL_BASED_OUTPATIENT_CLINIC_OR_DEPARTMENT_OTHER): Payer: Self-pay

## 2024-01-13 ENCOUNTER — Emergency Department (HOSPITAL_BASED_OUTPATIENT_CLINIC_OR_DEPARTMENT_OTHER)
Admission: EM | Admit: 2024-01-13 | Discharge: 2024-01-13 | Disposition: A | Discharge status: Home / Self Care | Payer: 59 | Attending: Emergency Medicine | Admitting: Emergency Medicine

## 2024-01-13 ENCOUNTER — Other Ambulatory Visit: Payer: Self-pay

## 2024-01-13 DIAGNOSIS — B349 Viral infection, unspecified: Secondary | ICD-10-CM | POA: Diagnosis not present

## 2024-01-13 DIAGNOSIS — Z21 Asymptomatic human immunodeficiency virus [HIV] infection status: Secondary | ICD-10-CM | POA: Diagnosis not present

## 2024-01-13 DIAGNOSIS — R519 Headache, unspecified: Secondary | ICD-10-CM | POA: Diagnosis present

## 2024-01-13 LAB — CBC WITH DIFFERENTIAL/PLATELET
Abs Immature Granulocytes: 0.03 10*3/uL (ref 0.00–0.07)
Basophils Absolute: 0 10*3/uL (ref 0.0–0.1)
Basophils Relative: 0 %
Eosinophils Absolute: 0.2 10*3/uL (ref 0.0–0.5)
Eosinophils Relative: 2 %
HCT: 37.7 % — ABNORMAL LOW (ref 39.0–52.0)
Hemoglobin: 12.5 g/dL — ABNORMAL LOW (ref 13.0–17.0)
Immature Granulocytes: 0 %
Lymphocytes Relative: 25 %
Lymphs Abs: 1.8 10*3/uL (ref 0.7–4.0)
MCH: 28.7 pg (ref 26.0–34.0)
MCHC: 33.2 g/dL (ref 30.0–36.0)
MCV: 86.7 fL (ref 80.0–100.0)
Monocytes Absolute: 0.7 10*3/uL (ref 0.1–1.0)
Monocytes Relative: 9 %
Neutro Abs: 4.5 10*3/uL (ref 1.7–7.7)
Neutrophils Relative %: 64 %
Platelets: 124 10*3/uL — ABNORMAL LOW (ref 150–400)
RBC: 4.35 MIL/uL (ref 4.22–5.81)
RDW: 12.2 % (ref 11.5–15.5)
WBC: 7.2 10*3/uL (ref 4.0–10.5)
nRBC: 0 % (ref 0.0–0.2)

## 2024-01-13 LAB — BASIC METABOLIC PANEL
Anion gap: 9 (ref 5–15)
BUN: 12 mg/dL (ref 6–20)
CO2: 24 mmol/L (ref 22–32)
Calcium: 8.9 mg/dL (ref 8.9–10.3)
Chloride: 102 mmol/L (ref 98–111)
Creatinine, Ser: 1.39 mg/dL — ABNORMAL HIGH (ref 0.61–1.24)
GFR, Estimated: >60 mL/min (ref >60)
Glucose, Bld: 94 mg/dL (ref 70–99)
Potassium: 3.9 mmol/L (ref 3.5–5.1)
Sodium: 135 mmol/L (ref 135–145)

## 2024-01-13 LAB — RESP PANEL BY RT-PCR (RSV, FLU A&B, COVID)  RVPGX2
Influenza A by PCR: NEGATIVE
Influenza B by PCR: NEGATIVE
Resp Syncytial Virus by PCR: NEGATIVE
SARS Coronavirus 2 by RT PCR: NEGATIVE

## 2024-01-13 LAB — LIPASE, BLOOD: Lipase: 31 U/L (ref 11–51)

## 2024-01-13 MED ORDER — ONDANSETRON HCL 4 MG PO TABS: 4.0000 mg | ORAL_TABLET | Freq: Three times a day (TID) | ORAL | 0 refills | Status: AC | PRN

## 2024-01-13 MED ORDER — ONDANSETRON 4 MG PO TBDP
4.0000 mg | ORAL_TABLET | Freq: Once | ORAL | Status: AC
  Administered 2024-01-13: 4 mg via ORAL
  Filled 2024-01-13: qty 1

## 2024-01-13 MED ORDER — SODIUM CHLORIDE 0.9 % IV BOLUS
1000.0000 mL | Freq: Once | INTRAVENOUS | Status: AC
  Administered 2024-01-13: 1000 mL via INTRAVENOUS

## 2024-01-13 NOTE — Discharge Instructions (Signed)
Your COVID, flu and RSV test were negative.  The rest of your blood work looked normal, although there was a small increase in your kidney function level or creatinine.  This could be due to mild dehydration.  You were given IV fluids in the ER.  Please make an effort to drink plenty of fluids at home for the next several days.  I believe you likely have a virus causing you to feel poorly.  Most viruses run their course in 5 to 7 days.  You can use over-the-counter medicine such as Tylenol and ibuprofen for muscle aches, headaches and fevers.  You can use Zofran which was prescribed for nausea.  Please schedule follow-up with your primary care provider in 1 week, particularly for continued to have fevers or feel ill.  If you have lightheadedness, loss of consciousness, difficulty breathing, chest pain, or any other emergency concerns, you can return to the emergency department.

## 2024-01-13 NOTE — ED Triage Notes (Signed)
Pt reports he is dehydrated. Fatigued and HA since Sunday . Vomited x 1 today. Denies diarrhea or pain. Dry mouth

## 2024-01-13 NOTE — ED Provider Notes (Signed)
[place] EMERGENCY DEPARTMENT AT [facility] [place] Provider Note   CSN: [id number] Arrival date & time: 01/13/24  1734     History  No chief complaint on file.   Dillon Lave. is a 43 y.o. male with history of HIV well-controlled on Biktarvy presenting to the ED with generalized fatigue, mild headache or lightheadedness, nausea with 1 episode of dry heaving today.  His symptoms began on Sunday, 2 days ago.  He reports he has a dry mouth, poor appetite, and wonders whether he is dehydrated.  Denies any sick contacts around him.  Denies fevers or coughing or sore throat.  Patient reports compliance with his Biktarvy and says that his viral loads are undetectable.  [facility]  In September 2024, last year, his HIV-1 RNA viral load was nondetectable  HPI     Home Medications Prior to Admission medications   Medication Sig Start Date End Date Taking? Authorizing Provider  ondansetron (ZOFRAN) 4 MG tablet Take 1 tablet (4 mg total) by mouth every 8 (eight) hours as needed for up to 12 doses for nausea or vomiting. 01/13/24  Yes Rabia Argote, Kermit Balo, MD  BIKTARVY 50-200-25 MG TABS tablet Take 1 tablet by mouth daily. 10/11/21   [provider]  cyclobenzaprine (FLEXERIL) 10 MG tablet Take 1 tablet (10 mg total) by mouth 2 (two) times daily as needed for muscle spasms. 11/22/23   Curatolo, Adam, DO  gabapentin (NEURONTIN) 100 MG capsule Take 1 capsule (100 mg total) by mouth 3 (three) times daily. 11/10/23   Mesner, Barbara Cower, MD  methylPREDNISolone (MEDROL DOSEPAK) 4 MG TBPK tablet Follow package insert 11/22/23   Curatolo, Adam, DO  omeprazole (PRILOSEC) 20 MG capsule Take 1 capsule by mouth daily. 02/14/17   [provider]      Allergies    Ciprofloxacin, Dapsone, and Primaquine    Review of Systems   Review of Systems  Physical Exam Updated Vital Signs BP (!) 143/88 (BP Location: Left Arm)   Pulse 93   Temp 100 F (37.8  C)   Resp 18   Ht 6\' 1"  (1.854 m)   Wt 90.4 kg   SpO2 100%   BMI 26.29 kg/m  Physical Exam Constitutional:      General: He is not in acute distress. HENT:     Head: Normocephalic and atraumatic.  Eyes:     Conjunctiva/sclera: Conjunctivae normal.     Pupils: Pupils are equal, round, and reactive to light.  Cardiovascular:     Rate and Rhythm: Normal rate and regular rhythm.  Pulmonary:     Effort: Pulmonary effort is normal. No respiratory distress.  Abdominal:     General: There is no distension.     Tenderness: There is no abdominal tenderness.  Skin:    General: Skin is warm and dry.  Neurological:     General: No focal deficit present.     Mental Status: He is alert and oriented to person, place, and time. Mental status is at baseline.  Psychiatric:        Mood and Affect: Mood normal.        Behavior: Behavior normal.     ED Results / Procedures / Treatments   Labs (all labs ordered are listed, but only abnormal results are displayed) Labs Reviewed  BASIC METABOLIC PANEL - Abnormal; Notable for the following components:      Result Value   Creatinine, Ser 1.39 (*)  All other components within normal limits  CBC WITH DIFFERENTIAL/PLATELET - Abnormal; Notable for the following components:   Hemoglobin 12.5 (*)    HCT 37.7 (*)    Platelets 124 (*)    All other components within normal limits  RESP PANEL BY RT-PCR (RSV, FLU A&B, COVID)  RVPGX2  LIPASE, BLOOD    EKG None  Radiology No results found.  Procedures Procedures    Medications Ordered in ED Medications  ondansetron (ZOFRAN-ODT) disintegrating tablet 4 mg (4 mg Oral Given 01/13/24 1804)  sodium chloride 0.9 % bolus 1,000 mL (1,000 mLs Intravenous New Bag/Given 01/13/24 1918)    ED Course/ Medical Decision Making/ A&P                                 Medical Decision Making Amount and/or Complexity of Data Reviewed Labs: ordered.  Risk Prescription drug management.   Patient is  here with constellation of symptoms most suggestive of viral syndrome.  Temperature was 100.0 on arrival.  There are no localizing symptoms to suggest bacterial infection, pneumonia, UTI.  I reviewed the patient's labs and workup, notable for no emergent findings  Zofran was given for nausea.  No indication for lumbar puncture or CT imaging at this time.  Doubt acute meningitis.  His reported "headache" is not sure headache pain, but some intermittent lightheadedness.  Patient given 1 L IV fluids as there was some mild elevation of his creatinine, and patient feeling that he cannot drink a lot of water tonight to compensate.  I do not believe is requiring hospitalization for hydration.  Otherwise stable.  Suspect this is a viral syndrome recommended conservative care for the next week and follow-up with his PCP.  I have a lower suspicion for opportunistic infection or HIV/AIDS infection given that the patient is well-controlled on his antiviral medication with undetectable viral load.  The patient's husband is present at the bedside for discharge        Final Clinical Impression(s) / ED Diagnoses Final diagnoses:  Viral illness    Rx / DC Orders ED Discharge Orders          Ordered    ondansetron (ZOFRAN) 4 MG tablet  Every 8 hours PRN        01/13/24 1858              Terald Sleeper, MD 01/13/24 1933

## 2024-05-11 DIAGNOSIS — Z1211 Encounter for screening for malignant neoplasm of colon: Secondary | ICD-10-CM | POA: Diagnosis not present

## 2024-05-11 DIAGNOSIS — K635 Polyp of colon: Secondary | ICD-10-CM | POA: Diagnosis not present

## 2024-05-28 DIAGNOSIS — K529 Noninfective gastroenteritis and colitis, unspecified: Secondary | ICD-10-CM | POA: Diagnosis not present

## 2024-08-30 DIAGNOSIS — Z23 Encounter for immunization: Secondary | ICD-10-CM | POA: Diagnosis not present

## 2024-08-30 DIAGNOSIS — Z0000 Encounter for general adult medical examination without abnormal findings: Secondary | ICD-10-CM | POA: Diagnosis not present

## 2024-08-30 DIAGNOSIS — K219 Gastro-esophageal reflux disease without esophagitis: Secondary | ICD-10-CM | POA: Diagnosis not present

## 2024-08-30 DIAGNOSIS — F439 Reaction to severe stress, unspecified: Secondary | ICD-10-CM | POA: Diagnosis not present

## 2024-08-30 DIAGNOSIS — B20 Human immunodeficiency virus [HIV] disease: Secondary | ICD-10-CM | POA: Diagnosis not present

## 2024-08-31 ENCOUNTER — Emergency Department (HOSPITAL_BASED_OUTPATIENT_CLINIC_OR_DEPARTMENT_OTHER): Payer: Self-pay

## 2024-08-31 ENCOUNTER — Emergency Department (HOSPITAL_BASED_OUTPATIENT_CLINIC_OR_DEPARTMENT_OTHER)
Admission: EM | Admit: 2024-08-31 | Discharge: 2024-08-31 | Disposition: A | Discharge status: Home / Self Care | Payer: Self-pay | Attending: Emergency Medicine | Admitting: Emergency Medicine

## 2024-08-31 ENCOUNTER — Other Ambulatory Visit: Payer: Self-pay

## 2024-08-31 DIAGNOSIS — M542 Cervicalgia: Secondary | ICD-10-CM

## 2024-08-31 DIAGNOSIS — M436 Torticollis: Secondary | ICD-10-CM | POA: Diagnosis not present

## 2024-08-31 DIAGNOSIS — Z21 Asymptomatic human immunodeficiency virus [HIV] infection status: Secondary | ICD-10-CM | POA: Diagnosis not present

## 2024-08-31 DIAGNOSIS — Y92410 Unspecified street and highway as the place of occurrence of the external cause: Secondary | ICD-10-CM | POA: Diagnosis not present

## 2024-08-31 MED ORDER — CYCLOBENZAPRINE HCL 10 MG PO TABS: 10.0000 mg | ORAL_TABLET | Freq: Three times a day (TID) | ORAL | 0 refills | Status: AC

## 2024-08-31 MED ORDER — DIAZEPAM 2 MG PO TABS
2.0000 mg | ORAL_TABLET | Freq: Once | ORAL | Status: AC
  Administered 2024-08-31: 2 mg via ORAL
  Filled 2024-08-31: qty 1
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
## 2024-08-31 NOTE — Discharge Instructions (Addendum)
 You were given a short course of muscle relaxers, please take 1 tablet up to 3 times a day to help with muscle spasms.  You may also benefit from some anti-inflammatories, heat, ice to your neck.  Experience any worsening symptoms return to emergency department.

## 2024-08-31 NOTE — ED Notes (Signed)
 DC paperwork given and verbally understood.

## 2024-09-02 DIAGNOSIS — M546 Pain in thoracic spine: Secondary | ICD-10-CM | POA: Diagnosis not present

## 2024-09-02 DIAGNOSIS — Z0000 Encounter for general adult medical examination without abnormal findings: Secondary | ICD-10-CM | POA: Diagnosis not present

## 2024-09-02 DIAGNOSIS — Z113 Encounter for screening for infections with a predominantly sexual mode of transmission: Secondary | ICD-10-CM | POA: Diagnosis not present

## 2024-09-09 DIAGNOSIS — F419 Anxiety disorder, unspecified: Secondary | ICD-10-CM | POA: Diagnosis not present

## 2024-09-30 DIAGNOSIS — F419 Anxiety disorder, unspecified: Secondary | ICD-10-CM | POA: Diagnosis not present

## 2024-10-22 DIAGNOSIS — L218 Other seborrheic dermatitis: Secondary | ICD-10-CM | POA: Diagnosis not present

## 2024-10-22 DIAGNOSIS — L309 Dermatitis, unspecified: Secondary | ICD-10-CM | POA: Diagnosis not present

## 2024-10-22 DIAGNOSIS — L720 Epidermal cyst: Secondary | ICD-10-CM | POA: Diagnosis not present

## 2024-11-04 DIAGNOSIS — F4322 Adjustment disorder with anxiety: Secondary | ICD-10-CM | POA: Diagnosis not present
